# Patient Record
Sex: Male | Born: 1990 | Race: White | Hispanic: No | Marital: Single | Smoking: Current some day smoker
Health system: Southern US, Community
[De-identification: ages and names within clinical notes are randomized; demographics above are authoritative.]

## PROBLEM LIST (undated history)

## (undated) DIAGNOSIS — T7840XA Allergy, unspecified, initial encounter: Secondary | ICD-10-CM

## (undated) DIAGNOSIS — R41840 Attention and concentration deficit: Secondary | ICD-10-CM

## (undated) HISTORY — DX: Allergy, unspecified, initial encounter: T78.40XA

## (undated) HISTORY — DX: Attention and concentration deficit: R41.840

---
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
## 2012-01-23 ENCOUNTER — Ambulatory Visit (INDEPENDENT_AMBULATORY_CARE_PROVIDER_SITE_OTHER): Payer: Managed Care, Other (non HMO) | Admitting: Family Medicine

## 2012-01-23 VITALS — BP 146/73 | HR 57 | Temp 97.5°F | Resp 16 | Ht 70.5 in | Wt 148.0 lb

## 2012-01-23 DIAGNOSIS — K645 Perianal venous thrombosis: Secondary | ICD-10-CM

## 2012-01-23 DIAGNOSIS — K6289 Other specified diseases of anus and rectum: Secondary | ICD-10-CM

## 2012-01-23 DIAGNOSIS — K644 Residual hemorrhoidal skin tags: Secondary | ICD-10-CM

## 2012-01-23 NOTE — Progress Notes (Signed)
  Patient Name: Anthony Frost Date of Birth: [birth year] Medical Record Number: [id number] Gender: male Date of Encounter: 01/23/2012  History of Present Illness:  Anthony Frost is a 21 y.o. very pleasant male patient who presents with the following:  Has to strain to lift a heavy object at work about 10 days ago- then on Monday he noted that he could fell hemorrhoids.  No bleeding.  They get smaller when he is asleep- bigger again when he moves around and works during the day.  Never had these before. Has been able to have a BM with minimal discomfort.  The main discomfort occurs with walking/ lifting.  Had tried OTC pain medications which help some- also is using prep h suppositories at night and prep h gel during the day.   Generally healthy.  No history of operations.   There is no problem list on file for this patient.  Past Medical History  Diagnosis Date  . Allergy    No past surgical history on file. History  Substance Use Topics  . Smoking status: Current Some Day Smoker -- 0.1 packs/day for 3 years    Types: Cigarettes  . Smokeless tobacco: Never Used  . Alcohol Use: Not on file   No family history on file. No Known Allergies  Medication list has been reviewed and updated.  Review of Systems: As per HPI- otherwise negative.   Physical Examination: Filed Vitals:   01/23/12 1049  BP: 146/73  Pulse: 57  Temp: 97.5 F (36.4 C)  TempSrc: Oral  Resp: 16  Height: 5' 10.5" (1.791 m)  Weight: 148 lb (67.132 kg)    Body mass index is 20.94 kg/(m^2).  GEN: WDWN, NAD, Non-toxic, A & O x 3 HEENT: Atraumatic, Normocephalic. Neck supple. No masses, No LAD. Ears and Nose: No external deformity. CV: RRR, No M/G/R. No JVD. No thrill. No extra heart sounds. PULM: CTA B, no wheezes, crackles, rhonchi. No retractions. No resp. distress. No accessory muscle use. ABD: S, NT, ND, +BS. No rebound. No HSM. EXTR: No c/c/e NEURO Normal gait.  PSYCH: Normally interactive.  Conversant. Not depressed or anxious appearing.  Calm demeanor.  Rectal exam: thrombosed hemorrohoid visible at 3:00   VC obtained.  Area prepped with betadine and anesthetized with 1cc of 1% lidocaine.  I and D of thrombosed hemorrhoid with 11 blade and removed clot.  No complications, placed gauze in cleft Assessment and Plan: Thrombosed hemorrhoid, I and D of same.  Should feel better now, instructed re sitz baths and keeping area clean.  Will ooze a bit for the next day or so- if bleeding more or longer let us know.  Keep stool soft. Patient (or parent if minor) instructed to return to clinic or call if not better in 2 day(s).

## 2012-04-02 ENCOUNTER — Telehealth: Payer: Self-pay

## 2012-04-02 ENCOUNTER — Ambulatory Visit: Payer: Managed Care, Other (non HMO) | Admitting: Family Medicine

## 2012-04-02 VITALS — BP 117/67 | HR 74 | Temp 98.1°F | Resp 16 | Ht 70.5 in | Wt 147.0 lb

## 2012-04-02 DIAGNOSIS — R1110 Vomiting, unspecified: Secondary | ICD-10-CM

## 2012-04-02 DIAGNOSIS — R6883 Chills (without fever): Secondary | ICD-10-CM

## 2012-04-02 DIAGNOSIS — J029 Acute pharyngitis, unspecified: Secondary | ICD-10-CM

## 2012-04-02 DIAGNOSIS — D649 Anemia, unspecified: Secondary | ICD-10-CM

## 2012-04-02 LAB — POCT CBC
Granulocyte percent: 78.8 %G (ref 37–80)
HCT, POC: 39.9 % — AB (ref 43.5–53.7)
Hemoglobin: 12.7 g/dL — AB (ref 14.1–18.1)
Lymph, poc: 1.2 (ref 0.6–3.4)
MCH, POC: 27.8 pg (ref 27–31.2)
MCHC: 31.8 g/dL (ref 31.8–35.4)
MCV: 87.4 fL (ref 80–97)
MID (cbc): 0.6 (ref 0–0.9)
MPV: 8.1 fL (ref 0–99.8)
POC Granulocyte: 6.8 (ref 2–6.9)
POC LYMPH PERCENT: 14.4 %L (ref 10–50)
POC MID %: 6.8 %M (ref 0–12)
Platelet Count, POC: 218 10*3/uL (ref 142–424)
RBC: 4.57 M/uL — AB (ref 4.69–6.13)
RDW, POC: 13.5 %
WBC: 8.6 10*3/uL (ref 4.6–10.2)

## 2012-04-02 LAB — POCT RAPID STREP A (OFFICE): Rapid Strep A Screen: NEGATIVE

## 2012-04-02 MED ORDER — ONDANSETRON HCL 8 MG PO TABS: 8.0000 mg | ORAL_TABLET | Freq: Three times a day (TID) | ORAL | Status: AC | PRN

## 2012-04-02 MED ORDER — ONDANSETRON 4 MG PO TBDP
8.0000 mg | ORAL_TABLET | Freq: Once | ORAL | Status: AC
  Administered 2012-04-02: 8 mg via ORAL

## 2012-04-02 NOTE — Telephone Encounter (Signed)
Called ins to get Prior auth on ondansetron and was told ins would cover #12 w/out PA and would then cover a RF of #12 w/in the month if needed. Called pharm and changed # 12 and notified pt. Told pt to CB if he still needs after the #12. Pt agreed.

## 2012-04-02 NOTE — Progress Notes (Signed)
Patient Name: Anthony Frost Date of Birth: Nov 05, 1991 Medical Record Number: [id number] Gender: male Date of Encounter: 04/02/2012  History of Present Illness:  Anthony Frost is a 21 y.o. very pleasant male patient who presents with the following:  Noted onset of a ST Sunday night.  Then yesterday he had some abdominal pain- now resolved.   No fever that he has noted, but he has had chills and "been freezing."  He did vomit this morning but he thought this was due to ST and the fact that he could not cough up his mucus.  He also has a cough and feels very tired. He noted some aches and fatigue yesterday.  Slept well last night but does not feel refreshed.   He also had diarrhea yesterday during the day- better today.    Generally healthy person. Sick contacts at work  There is no problem list on file for this patient.  Past Medical History  Diagnosis Date  . Allergy    No past surgical history on file. History  Substance Use Topics  . Smoking status: Current Some Day Smoker -- 0.1 packs/day for 3 years    Types: Cigarettes  . Smokeless tobacco: Never Used  . Alcohol Use: 0.0 oz/week     social   No family history on file. No Known Allergies  Medication list has been reviewed and updated.  Review of Systems: As per HPI- otherwise negative.   Physical Examination: Filed Vitals:   04/02/12 0929  BP: 117/67  Pulse: 74  Temp: 98.1 F (36.7 C)  TempSrc: Oral  Resp: 16  Height: 5' 10.5" (1.791 m)  Weight: 147 lb (66.679 kg)    Body mass index is 20.79 kg/(m^2).  GEN: WDWN, NAD, Non-toxic, A & O x 3- looks well HEENT: Atraumatic, Normocephalic. Neck supple. No masses, No LAD.  Tm wnl, oropharynx injected but no exudate.   Ears and Nose: No external deformity. CV: RRR, No M/G/R. No JVD. No thrill. No extra heart sounds. PULM: CTA B, no wheezes, crackles, rhonchi. No retractions. No resp. distress. No accessory muscle use. ABD: S, ND +BS. No rebound. No HSM.  There  is no tenderness to abdominal exam.  EXTR: No c/c/e NEURO Normal gait.  PSYCH: Normally interactive. Conversant. Not depressed or anxious appearing.  Calm demeanor.   Given zofran 8mg  here Results for orders placed in visit on 04/02/12  POCT RAPID STREP A (OFFICE)      Component Value Range   Rapid Strep A Screen Negative  Negative   POCT CBC      Component Value Range   WBC 8.6  4.6 - 10.2 (K/uL)   Lymph, poc 1.2  0.6 - 3.4    POC LYMPH PERCENT 14.4  10 - 50 (%L)   MID (cbc) 0.6  0 - 0.9    POC MID % 6.8  0 - 12 (%M)   POC Granulocyte 6.8  2 - 6.9    Granulocyte percent 78.8  37 - 80 (%G)   RBC 4.57 (*) 4.69 - 6.13 (M/uL)   Hemoglobin 12.7 (*) 14.1 - 18.1 (g/dL)   HCT, POC 64.4 (*) 03.4 - 53.7 (%)   MCV 87.4  80 - 97 (fL)   MCH, POC 27.8  27 - 31.2 (pg)   MCHC 31.8  31.8 - 35.4 (g/dL)   RDW, POC 74.2     Platelet Count, POC 218  142 - 424 (K/uL)   MPV 8.1  0 - 99.8 (fL)  Assessment and Plan: 1. Sore throat  POCT rapid strep A, POCT CBC, Culture, Group A Strep  2. Vomiting  ondansetron (ZOFRAN-ODT) disintegrating tablet 8 mg, ondansetron (ZOFRAN) 8 MG tablet  3. Chills    4. Anemia  POCT CBC    Suspect viral URI- await throat culture but doubt strep throat.  Fluids, rest.  OOW for today and tomorrow.  Let us know if not feeling better in the next couple of days- Sooner if worse.

## 2012-04-05 LAB — CULTURE, GROUP A STREP

## 2012-04-07 MED ORDER — PENICILLIN V POTASSIUM 500 MG PO TABS: 500.0000 mg | ORAL_TABLET | Freq: Two times a day (BID) | ORAL | Status: AC

## 2012-04-07 NOTE — Progress Notes (Signed)
Addended by: Abbe Amsterdam C on: 04/07/2012 02:35 PM   Modules accepted: Orders

## 2012-07-31 ENCOUNTER — Ambulatory Visit (INDEPENDENT_AMBULATORY_CARE_PROVIDER_SITE_OTHER): Payer: Managed Care, Other (non HMO) | Admitting: Family Medicine

## 2012-07-31 VITALS — BP 90/60 | HR 91 | Temp 99.2°F | Resp 18

## 2012-07-31 DIAGNOSIS — R066 Hiccough: Secondary | ICD-10-CM

## 2012-07-31 DIAGNOSIS — D649 Anemia, unspecified: Secondary | ICD-10-CM

## 2012-07-31 LAB — POCT CBC
Granulocyte percent: 52.8 %G (ref 37–80)
HCT, POC: 45.9 % (ref 43.5–53.7)
Hemoglobin: 14.2 g/dL (ref 14.1–18.1)
Lymph, poc: 2.5 (ref 0.6–3.4)
MCH, POC: 28.3 pg (ref 27–31.2)
MCHC: 30.9 g/dL — AB (ref 31.8–35.4)
MCV: 91.4 fL (ref 80–97)
MID (cbc): 0.6 (ref 0–0.9)
MPV: 8.8 fL (ref 0–99.8)
POC Granulocyte: 3.5 (ref 2–6.9)
POC LYMPH PERCENT: 37.6 %L (ref 10–50)
POC MID %: 9.6 %M (ref 0–12)
Platelet Count, POC: 245 10*3/uL (ref 142–424)
RBC: 5.02 M/uL (ref 4.69–6.13)
RDW, POC: 13.1 %
WBC: 6.7 10*3/uL (ref 4.6–10.2)

## 2012-07-31 MED ORDER — METOCLOPRAMIDE HCL 10 MG PO TABS: 10.0000 mg | ORAL_TABLET | Freq: Four times a day (QID) | ORAL | Status: DC

## 2012-07-31 NOTE — Patient Instructions (Addendum)
If your hiccups are gone you do not need to take the medication.  However, if they come back you can try the metoclopramide.  Let me know if this does not help.

## 2012-07-31 NOTE — Progress Notes (Signed)
[facility] [address] [place] [address] [phone]  Date:  07/31/2012   Name:  Anthony Frost   DOB:  Apr 15, 1991   MRN:  [id number]  PCP:  No primary provider on file.    Chief Complaint: Hiccups and bloodwork   History of Present Illness:  Anthony Frost is a 21 y.o. very pleasant male patient who presents with the following:  He had noted hiccups for about 4 days- he is not sure why this started.  He drank his protein shake like usual, and then developed the hiccups Sunday morning.  Today is Wednesday.  His hiccups have been persistent since then and were really starting to annoy him and interfered with sleep- however, about an hour ago they finally stopped.  Otherwise he is feeling well- did have some heartburn due to the hiccups.   He is also here to re-check his CBC from May- he was mildly anemia at that time.    There is no problem list on file for this patient.   Past Medical History  Diagnosis Date  . Allergy     No past surgical history on file.  History  Substance Use Topics  . Smoking status: Current Some Day Smoker -- 0.1 packs/day for 3 years    Types: Cigarettes  . Smokeless tobacco: Never Used  . Alcohol Use: 0.0 oz/week     social    No family history on file.  No Known Allergies  Medication list has been reviewed and updated.  No current outpatient prescriptions on file prior to visit.    Review of Systems:  As per HPI- otherwise negative.   Physical Examination: Filed Vitals:   07/31/12 1610  BP: 90/60  Pulse: 91  Temp: 99.2 F (37.3 C)  Resp: 18   There were no vitals filed for this visit. There is no height or weight on file to calculate BMI. Ideal Body Weight:    GEN: WDWN, NAD, Non-toxic, A & O x 3 HEENT: Atraumatic, Normocephalic. Neck supple. No masses, No LAD.  Oropharynx wnl, PEERL, TM wnl Ears and Nose: No external deformity. CV: RRR, No M/G/R. No JVD. No thrill. No extra heart sounds. PULM:  CTA B, no wheezes, crackles, rhonchi. No retractions. No resp. distress. No accessory muscle use. ABD: S, NT, ND EXTR: No c/c/e NEURO Normal gait. No hiccups noted PSYCH: Normally interactive. Conversant. Not depressed or anxious appearing.  Calm demeanor.   Results for orders placed in visit on 07/31/12  POCT CBC      Component Value Range   WBC 6.7  4.6 - 10.2 K/uL   Lymph, poc 2.5  0.6 - 3.4   POC LYMPH PERCENT 37.6  10 - 50 %L   MID (cbc) 0.6  0 - 0.9   POC MID % 9.6  0 - 12 %M   POC Granulocyte 3.5  2 - 6.9   Granulocyte percent 52.8  37 - 80 %G   RBC 5.02  4.69 - 6.13 M/uL   Hemoglobin 14.2  14.1 - 18.1 g/dL   HCT, POC 57.8  46.9 - 53.7 %   MCV 91.4  80 - 97 fL   MCH, POC 28.3  27 - 31.2 pg   MCHC 30.9 (*) 31.8 - 35.4 g/dL   RDW, POC 62.9     Platelet Count, POC 245  142 - 424 K/uL   MPV 8.8  0 - 99.8 fL    Assessment and Plan: 1.  Hiccups  metoCLOPramide (REGLAN) 10 MG tablet  2. Anemia  POCT CBC   Previously noted anemia is resolved.  Persistent hiccups- finally stopped.  Gave rx for reglan which he can use if they come back.  However did warn him that reglan can be associated with tardive dyskinesia, so avoid use unless necessary.  Meds ordered this encounter  Medications  . metoCLOPramide (REGLAN) 10 MG tablet    Sig: Take 1 tablet (10 mg total) by mouth 4 (four) times daily.    Dispense:  30 tablet    Refill:  0   Let me know if any other problems  Sandy Blouch, MD

## 2013-11-13 ENCOUNTER — Ambulatory Visit: Payer: Managed Care, Other (non HMO) | Admitting: Family Medicine

## 2013-11-13 VITALS — BP 133/77 | HR 75 | Temp 98.7°F | Resp 16 | Ht 70.5 in | Wt 148.0 lb

## 2013-11-13 DIAGNOSIS — R05 Cough: Secondary | ICD-10-CM

## 2013-11-13 DIAGNOSIS — J209 Acute bronchitis, unspecified: Secondary | ICD-10-CM

## 2013-11-13 MED ORDER — HYDROCODONE-HOMATROPINE 5-1.5 MG/5ML PO SYRP: 5.0000 mL | ORAL_SOLUTION | Freq: Three times a day (TID) | ORAL | Status: DC | PRN

## 2013-11-13 MED ORDER — AZITHROMYCIN 250 MG PO TABS: ORAL_TABLET | ORAL | Status: DC

## 2013-11-13 NOTE — Progress Notes (Signed)
[facility] [address], [place] [address] [phone]  Date:  11/13/2013   Name:  Anthony Frost   DOB:  01/10/1991   MRN:  [id number]  PCP:  No primary provider on file.    Chief Complaint: Cough, Nasal Congestion, Nausea and Generalized Body Aches   History of Present Illness:  Anthony Frost is a 22 y.o. very pleasant male patient who presents with the following:  He has noted "simple cold sx" for about 3 weeks.  He is worse at night- he will wake up coughing.  He feels SOB when he tries to work- but not when he went snowboarding last weekend.  He thinks that his snowboarding trip may have made him worse.  Feeling tired.   He does feel pretty good during the day, but at night he is having a hard time.  He has not noted a fever.   Cough can be productive especially at night.  He has noted some body aches over the last several days.   He also notes a runny nose and sneezing.   He took dayquil so far today.    He has tried several different OTC medications  He has had a sick contact at work.    He is generally healthy  There are no active problems to display for this patient.   Past Medical History  Diagnosis Date  . Allergy     No past surgical history on file.  History  Substance Use Topics  . Smoking status: Current Some Day Smoker -- 0.10 packs/day for 3 years    Types: Cigarettes  . Smokeless tobacco: Never Used  . Alcohol Use: 0.0 oz/week     Comment: social    No family history on file.  No Known Allergies  Medication list has been reviewed and updated.  Current Outpatient Prescriptions on File Prior to Visit  Medication Sig Dispense Refill  . metoCLOPramide (REGLAN) 10 MG tablet Take 1 tablet (10 mg total) by mouth 4 (four) times daily.  30 tablet  0   No current facility-administered medications on file prior to visit.    Review of Systems:  As per HPI- otherwise negative.   Physical Examination: Filed Vitals:    11/13/13 1406  BP: 133/77  Pulse: 75  Temp: 98.7 F (37.1 C)  Resp: 16   Filed Vitals:   11/13/13 1406  Height: 5' 10.5" (1.791 m)  Weight: 148 lb (67.132 kg)   Body mass index is 20.93 kg/(m^2). Ideal Body Weight: Weight in (lb) to have BMI = 25: 176.4  GEN: WDWN, NAD, Non-toxic, A & O x 3, looks well HEENT: Atraumatic, Normocephalic. Neck supple. No masses, No LAD.  Bilateral TM wnl, oropharynx normal.  PEERL,EOMI.   Ears and Nose: No external deformity. CV: RRR, No M/G/R. No JVD. No thrill. No extra heart sounds. PULM: CTA B, no wheezes, crackles, rhonchi. No retractions. No resp. distress. No accessory muscle use. EXTR: No c/c/e NEURO Normal gait.  PSYCH: Normally interactive. Conversant. Not depressed or anxious appearing.  Calm demeanor.    Assessment and Plan: Cough - Plan: HYDROcodone-homatropine (HYCODAN) 5-1.5 MG/5ML syrup  Acute bronchitis - Plan: azithromycin (ZITHROMAX) 250 MG tablet  Persistent cough that is disturbing sleep.  Bronchitis.  Treat as above.  See patient instructions for more details.     Signed Abbe Amsterdam, MD

## 2013-11-13 NOTE — Patient Instructions (Signed)
Use the azithromycin antibiotic as directed and the cough syrup as needed.  Remember the cough syrup can make you sleepy, so do not use it prior to driving.  Let me know if you are not better soon- Sooner if worse.

## 2015-09-22 ENCOUNTER — Ambulatory Visit (INDEPENDENT_AMBULATORY_CARE_PROVIDER_SITE_OTHER): Payer: Self-pay | Admitting: Family Medicine

## 2015-09-22 VITALS — BP 100/70 | HR 95 | Temp 98.8°F | Resp 16 | Ht 71.0 in | Wt 144.0 lb

## 2015-09-22 DIAGNOSIS — B353 Tinea pedis: Secondary | ICD-10-CM

## 2015-09-22 DIAGNOSIS — Z131 Encounter for screening for diabetes mellitus: Secondary | ICD-10-CM

## 2015-09-22 DIAGNOSIS — D509 Iron deficiency anemia, unspecified: Secondary | ICD-10-CM

## 2015-09-22 LAB — POCT CBC
Granulocyte percent: 57.7 %G (ref 37–80)
HCT, POC: 45.1 % (ref 43.5–53.7)
HEMOGLOBIN: 14.9 g/dL (ref 14.1–18.1)
LYMPH, POC: 3.2 (ref 0.6–3.4)
MCH, POC: 28.9 pg (ref 27–31.2)
MCHC: 33 g/dL (ref 31.8–35.4)
MCV: 87.4 fL (ref 80–97)
MID (CBC): 0.8 (ref 0–0.9)
MPV: 7.1 fL (ref 0–99.8)
POC GRANULOCYTE: 5.4 (ref 2–6.9)
POC LYMPH %: 33.9 % (ref 10–50)
POC MID %: 8.4 %M (ref 0–12)
Platelet Count, POC: 219 10*3/uL (ref 142–424)
RBC: 5.17 M/uL (ref 4.69–6.13)
RDW, POC: 12.7 %
WBC: 9.4 10*3/uL (ref 4.6–10.2)

## 2015-09-22 LAB — GLUCOSE, POCT (MANUAL RESULT ENTRY): POC Glucose: 83 mg/dl (ref 70–99)

## 2015-09-22 MED ORDER — KETOCONAZOLE 2 % EX CREA: 1.0000 "application " | TOPICAL_CREAM | Freq: Two times a day (BID) | CUTANEOUS | Status: DC

## 2015-09-22 MED ORDER — FLUCONAZOLE 150 MG PO TABS: 150.0000 mg | ORAL_TABLET | Freq: Once | ORAL | Status: DC

## 2015-09-22 NOTE — Progress Notes (Signed)
Subjective:    Patient ID: Anthony Frost, male    DOB: 1991/02/02, 24 y.o.   MRN: [id number]  09/22/2015  Recurrent Skin Infections and Depression   HPI This 24 y.o. male presents for evaluation of R foot infection.  Evaluated by another Urgent Care on 03-20-15; treated with antibiotic with improvement. One month ago, recurrent symptoms in R foot; in past two weeks, infection has worsened.  Now also having similar itchiness and burning in R axilla.  Also has L skin lesions on face that are itchy.  R foot infection really itches and burns.  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  Has not applied any ointments or creams.  No fever/chills/sweats.  No foot swelling.  Wound is draining excessively.  ADD managed by local psychiatry group; recently lost insurance because aged out of parents' plan.  Now can only afford Adderall; cannot afford Vyvanse. Mood has been down for the past month off of Vyvanse.  No SI.  Adjusting to Adderall only treatment.     Review of Systems  Constitutional: Negative for fever, chills, diaphoresis and fatigue.  Cardiovascular: Negative for leg swelling.  Skin: Positive for color change, rash and wound.  Neurological: Negative for weakness and numbness.    Past Medical History  Diagnosis Date  . Allergy    History reviewed. No pertinent past surgical history. No Known Allergies  Social History   Social History  . Marital Status: Single    Spouse Name: N/A  . Number of Children: N/A  . Years of Education: N/A   Occupational History  . Not on file.   Social History Main Topics  . Smoking status: Current Some Day Smoker -- 0.10 packs/day for 3 years    Types: Cigarettes  . Smokeless tobacco: Never Used  . Alcohol Use: 0.0 oz/week     Comment: social  . Drug Use: Not on file  . Sexual Activity: Not on file   Other Topics Concern  . Not on file   Social History Narrative    Family History  Problem Relation Age of Onset  . High Cholesterol Mother   . Cancer Father   . High Cholesterol Father   . Hypertension Father        Objective:    BP 100/70 mmHg  Pulse 95  Temp(Src) 98.8 F (37.1 C) (Oral)  Resp 16  Ht  (1.803 m)  Wt 144 lb (65.318 kg)  BMI 20.09 kg/m2  SpO2 98% Physical Exam  Constitutional: He is oriented to person, place, and time. He appears well-developed and well-nourished. No distress.  HENT:  Head: Normocephalic and atraumatic.  Eyes: Conjunctivae and EOM are normal. Pupils are equal, round, and reactive to light.  Neck: Normal range of motion. Neck supple. Carotid bruit is not present. No thyromegaly present.  Cardiovascular: Normal rate, regular rhythm, normal heart sounds and intact distal pulses.  Exam reveals no gallop and no friction rub.   No murmur heard. Pulmonary/Chest: Effort normal and breath sounds normal. He has no wheezes. He has no rales.  Musculoskeletal:       Feet:  Lymphadenopathy:    He has no cervical adenopathy.  Neurological: He is alert and oriented to person, place, and time. No cranial nerve deficit.  Skin: Skin is warm and dry. Rash noted. He is not diaphoretic. There is erythema.  R lateral foot with localized area of moist erythema with  interdigit involvement between 4th and 5th digits and 3rd and 4th digits.  No fluctuance; no streaking.  R axilla with area of erythema without induration; no pustules or vesicles.  L lateral temple region with papular rash.  Psychiatric: He has a normal mood and affect. His behavior is normal.  Nursing note and vitals reviewed.  Results for orders placed or performed in visit on 09/22/15  POCT CBC  Result Value Ref Range   WBC 9.4 4.6 - 10.2 K/uL   Lymph, poc 3.2 0.6 - 3.4   POC LYMPH PERCENT 33.9 10 - 50 %L   MID (cbc) 0.8 0 - 0.9   POC MID % 8.4 0 - 12 %M   POC Granulocyte 5.4 2 - 6.9   Granulocyte percent 57.7 37 - 80 %G   RBC 5.17 4.69 - 6.13 M/uL    Hemoglobin 14.9 14.1 - 18.1 g/dL   HCT, POC 16.145.1 09.643.5 - 53.7 %   MCV 87.4 80 - 97 fL   MCH, POC 28.9 27 - 31.2 pg   MCHC 33.0 31.8 - 35.4 g/dL   RDW, POC 04.512.7 %   Platelet Count, POC 219 142 - 424 K/uL   MPV 7.1 0 - 99.8 fL  POCT glucose (manual entry)  Result Value Ref Range   POC Glucose 83 70 - 99 mg/dl       Assessment & Plan:   1. Tinea pedis of right foot   2. Screening for diabetes mellitus   3. Anemia, iron deficiency    -New. -Rx for Ketoconazole cream and Diflucan. -Change socks frequently; avoid excessive moisture.   -Avoid socks at nighttime.  -RTC for acute worsening.   Orders Placed This Encounter  Procedures  . POCT CBC  . POCT glucose (manual entry)   Meds ordered this encounter  Medications  . lisdexamfetamine (VYVANSE) 40 MG capsule    Sig: Take 40 mg by mouth every morning.  Marland Kitchen. amphetamine-dextroamphetamine (ADDERALL XR) 30 MG 24 hr capsule    Sig: Take 30 mg by mouth daily.  Marland Kitchen. ketoconazole (NIZORAL) 2 % cream    Sig: Apply 1 application topically 2 (two) times daily.    Dispense:  30 g    Refill:  0  . fluconazole (DIFLUCAN) 150 MG tablet    Sig: Take 1 tablet (150 mg total) by mouth once. Repeat in 1 week    Dispense:  2 tablet    Refill:  0    No Follow-up on file.    Kristi Paulita FujitaMartin Smith, M.D. [facility]  [place] [address] [place], [place]  [id number] [phone] phone [phone] fax

## 2015-09-22 NOTE — Patient Instructions (Signed)
1. Wash socks in hot water. 2. Change wet socks frequently. 3.  Do not wear socks at bedtime; allow area to air out.  4.  Return for worsening redness, swelling, pain.  Athlete's Foot Athlete's foot (tinea pedis) is a fungal infection of the skin on the feet. It often occurs on the skin between the toes or underneath the toes. It can also occur on the soles of the feet. Athlete's foot is more likely to occur in hot, humid weather. Not washing your feet or changing your socks often enough can contribute to athlete's foot. The infection can spread from person to person (contagious). CAUSES Athlete's foot is caused by a fungus. This fungus thrives in warm, moist places. Most people get athlete's foot by sharing shower stalls, towels, and wet floors with an infected person. People with weakened immune systems, including those with diabetes, may be more likely to get athlete's foot. SYMPTOMS   Itchy areas between the toes or on the soles of the feet.  White, flaky, or scaly areas between the toes or on the soles of the feet.  Tiny, intensely itchy blisters between the toes or on the soles of the feet.  Tiny cuts on the skin. These cuts can develop a bacterial infection.  Thick or discolored toenails. DIAGNOSIS  Your caregiver can usually tell what the problem is by doing a physical exam. Your caregiver may also take a skin sample from the rash area. The skin sample may be examined under a microscope, or it may be tested to see if fungus will grow in the sample. A sample may also be taken from your toenail for testing. TREATMENT  Over-the-counter and prescription medicines can be used to kill the fungus. These medicines are available as powders or creams. Your caregiver can suggest medicines for you. Fungal infections respond slowly to treatment. You may need to continue using your medicine for several weeks. PREVENTION   Do not share towels.  Wear sandals in wet areas, such as shared locker  rooms and shared showers.  Keep your feet dry. Wear shoes that allow air to circulate. Wear cotton or wool socks. HOME CARE INSTRUCTIONS   Take medicines as directed by your caregiver. Do not use steroid creams on athlete's foot.  Keep your feet clean and cool. Wash your feet daily and dry them thoroughly, especially between your toes.  Change your socks every day. Wear cotton or wool socks. In hot climates, you may need to change your socks 2 to 3 times per day.  Wear sandals or canvas tennis shoes with good air circulation.  If you have blisters, soak your feet in Burow's solution or Epsom salts for 20 to 30 minutes, 2 times a day to dry out the blisters. Make sure you dry your feet thoroughly afterward. SEEK MEDICAL CARE IF:   You have a fever.  You have swelling, soreness, warmth, or redness in your foot.  You are not getting better after 7 days of treatment.  You are not completely cured after 30 days.  You have any problems caused by your medicines. MAKE SURE YOU:   Understand these instructions.  Will watch your condition.  Will get help right away if you are not doing well or get worse.   This information is not intended to replace advice given to you by your health care provider. Make sure you discuss any questions you have with your health care provider.   Document Released: 11/03/2000 Document Revised: 01/29/2012 Document Reviewed: 05/10/2015  Chartered certified accountant Patient Education [employer].

## 2015-10-27 ENCOUNTER — Other Ambulatory Visit: Payer: Self-pay | Admitting: Family Medicine

## 2015-11-06 ENCOUNTER — Other Ambulatory Visit: Payer: Self-pay | Admitting: Family Medicine

## 2015-11-08 NOTE — Telephone Encounter (Signed)
Patient's mom called and wants to leave her contact number because the patient's phone is broke. Mother's name is Raynelle FanningJulie and please call her at [phone]

## 2015-11-08 NOTE — Telephone Encounter (Signed)
Dr. Katrinka BlazingSmith  Patient stated his symptoms cleared for about two weeks and is now back even worse.  Do you want him to take another round of Diflucan or RTC.

## 2015-11-09 MED ORDER — KETOCONAZOLE 2 % EX CREA: 1.0000 "application " | TOPICAL_CREAM | Freq: Two times a day (BID) | CUTANEOUS | Status: DC

## 2015-11-09 NOTE — Telephone Encounter (Signed)
Wrong number for mom.   Tried [facility] number mailbox fill.

## 2015-11-09 NOTE — Telephone Encounter (Signed)
Discussed with Dr. Katrinka BlazingSmith.  Refilled cream, per her recommendations. Use twice daily until symptoms resolve, then  5 additional days.  Meds ordered this encounter  Medications  . ketoconazole (NIZORAL) 2 % cream    Sig: Apply 1 application topically 2 (two) times daily.    Dispense:  60 g    Refill:  0    Order Specific Question:  Supervising Provider    Answer:  DOOLITTLE, ROBERT P [[id number]]

## 2015-11-10 NOTE — Telephone Encounter (Signed)
Called mother back and gave her instr's from [place]. She agreed to advise pt.

## 2016-02-14 ENCOUNTER — Emergency Department (HOSPITAL_BASED_OUTPATIENT_CLINIC_OR_DEPARTMENT_OTHER)
Admission: EM | Admit: 2016-02-14 | Discharge: 2016-02-15 | Disposition: A | Discharge status: Home / Self Care | Payer: BLUE CROSS/BLUE SHIELD | Attending: Emergency Medicine | Admitting: Emergency Medicine

## 2016-02-14 ENCOUNTER — Encounter (HOSPITAL_BASED_OUTPATIENT_CLINIC_OR_DEPARTMENT_OTHER): Payer: Self-pay | Admitting: *Deleted

## 2016-02-14 DIAGNOSIS — F909 Attention-deficit hyperactivity disorder, unspecified type: Secondary | ICD-10-CM | POA: Insufficient documentation

## 2016-02-14 DIAGNOSIS — F17210 Nicotine dependence, cigarettes, uncomplicated: Secondary | ICD-10-CM | POA: Diagnosis not present

## 2016-02-14 DIAGNOSIS — Z79899 Other long term (current) drug therapy: Secondary | ICD-10-CM | POA: Insufficient documentation

## 2016-02-14 DIAGNOSIS — L84 Corns and callosities: Secondary | ICD-10-CM | POA: Diagnosis not present

## 2016-02-14 DIAGNOSIS — M79671 Pain in right foot: Secondary | ICD-10-CM | POA: Diagnosis present

## 2016-02-14 NOTE — Discharge Instructions (Signed)
Corns and Calluses Corns are small areas of thickened skin that occur on the top, sides, or tip of a toe. They contain a cone-shaped core with a point that can press on a nerve below. This causes pain. Calluses are areas of thickened skin that can occur anywhere on the body including hands, fingers, palms, soles of the feet, and heels.Calluses are usually larger than corns.  CAUSES  Corns and calluses are caused by rubbing (friction) or pressure, such as from shoes that are too tight or do not fit properly.  RISK FACTORS Corns are more likely to develop in people who have toe deformities, such as hammer toes. Since calluses can occur with friction to any area of the skin, calluses are more likely to develop in people who:   Work with their hands.  Wear shoes that fit poorly, shoes that are too tight, or shoes that are high-heeled.  Have toes deformities. SYMPTOMS Symptoms of a corn or callus include:  A hard growth on the skin.   Pain or tenderness under the skin.   Redness and swelling.   Increased discomfort while wearing tight-fitting shoes. DIAGNOSIS  Corns and calluses may be diagnosed with a medical history and physical exam.  TREATMENT  Corns and calluses may be treated with:  Removing the cause of the friction or pressure. This may include:  Changing your shoes.  Wearing shoe inserts (orthotics) or other protective layers in your shoes, such as a corn pad.  Wearing gloves.  Medicines to help soften skin in the hardened, thickened areas.  Reducing the size of the corn or callus by removing the dead layers of skin.  Antibiotic medicines to treat infection.  Surgery, if a toe deformity is the cause. HOME CARE INSTRUCTIONS   Take medicines only as directed by your health care provider.  If you were prescribed an antibiotic, finish all of it even if you start to feel better.  Wear shoes that fit well. Avoid wearing high-heeled shoes and shoes that are too tight  or too loose.  Wear any padding, protective layers, gloves, or orthotics as directed by your health care provider.  Soak your hands or feet and then use a file or pumice stone to soften your corn or callus. Do this as directed by your health care provider.  Check your corn or callus every day for signs of infection. Watch for:  Redness, swelling, or pain.  Fluid, blood, or pus. SEEK MEDICAL CARE IF:   Your symptoms do not improve with treatment.  You have increased redness, swelling, or pain at the site of your corn or callus.  You have fluid, blood, or pus coming from your corn or callus.  You have new symptoms.   This information is not intended to replace advice given to you by your health care provider. Make sure you discuss any questions you have with your health care provider.   Document Released: 08/12/2004 Document Revised: 03/23/2015 Document Reviewed: 11/02/2014 Elsevier Interactive Patient Education 2016 [employer]  

## 2016-02-14 NOTE — ED Notes (Signed)
Pain to the bottom of his right foot. A few months ago he had athletes foot per mother.

## 2016-02-14 NOTE — ED Provider Notes (Signed)
CSN: [id number]     Arrival date & time 02/14/16  2059 History   First MD Initiated Contact with Patient 02/14/16 2342     Chief Complaint  Patient presents with  . Foot Pain     (Consider location/radiation/quality/duration/timing/severity/associated sxs/prior Treatment) HPI Comments: Patient reports pain to the outer aspect of his right foot below his 5th toe with thickening of the skin.  Patient is a 25 y.o. male presenting with lower extremity pain. The history is provided by the patient. No language interpreter was used.  Foot Pain This is a new problem. The current episode started in the past 7 days. The problem has been gradually worsening. The symptoms are aggravated by walking.    Past Medical History  Diagnosis Date  . Allergy   . Attention deficit    History reviewed. No pertinent past surgical history. Family History  Problem Relation Age of Onset  . High Cholesterol Mother   . Cancer Father   . High Cholesterol Father   . Hypertension Father    Social History  Substance Use Topics  . Smoking status: Current Some Day Smoker -- 0.10 packs/day for 3 years    Types: Cigarettes  . Smokeless tobacco: Never Used  . Alcohol Use: 0.0 oz/week     Comment: social    Review of Systems  Musculoskeletal:       Foot pain  All other systems reviewed and are negative.     Allergies  Review of patient's allergies indicates no known allergies.  Home Medications   Prior to Admission medications   Medication Sig Start Date End Date Taking? Authorizing Provider  amphetamine-dextroamphetamine (ADDERALL XR) 30 MG 24 hr capsule Take 30 mg by mouth daily.    Historical Provider, MD  fluconazole (DIFLUCAN) 150 MG tablet Take 1 tablet (150 mg total) by mouth once. Repeat in 1 week 09/22/15   Ethelda ChickKristi M Annalisa Colonna, MD  ketoconazole (NIZORAL) 2 % cream Apply 1 application topically 2 (two) times daily. 11/09/15   Chelle Jeffery, PA-C  lisdexamfetamine (VYVANSE) 40 MG capsule Take 40  mg by mouth every morning.    Historical Provider, MD   BP 122/80 mmHg  Pulse 68  Temp(Src) 98.1 F (36.7 C) (Oral)  Resp 18  Ht 5\' 11"  (1.803 m)  Wt 65.772 kg  BMI 20.23 kg/m2  SpO2 98% Physical Exam  Constitutional: He is oriented to person, place, and time. He appears well-developed and well-nourished.  HENT:  Head: Normocephalic.  Eyes: Conjunctivae are normal.  Cardiovascular: Normal rate.   Pulmonary/Chest: Effort normal.  Musculoskeletal: He exhibits tenderness.       Feet:  Neurological: He is alert and oriented to person, place, and time.  Skin: Skin is warm and dry.  Psychiatric: He has a normal mood and affect.  Nursing note and vitals reviewed.   ED Course  Procedures (including critical care time) Labs Review Labs Reviewed - No data to display  Imaging Review No results found. I have personally reviewed and evaluated these images and lab results as part of my medical decision-making.   EKG Interpretation None      MDM   Final diagnoses:  None  Plantar callous. Care instructions provided. Return precautions discussed. Follow-up with PCP or podiatry.      Felicie Mornavid Taleeyah Bora, NP 02/15/16 0113  Paula LibraJohn Molpus, MD 02/15/16 [phone]

## 2016-05-25 ENCOUNTER — Ambulatory Visit (INDEPENDENT_AMBULATORY_CARE_PROVIDER_SITE_OTHER): Payer: BLUE CROSS/BLUE SHIELD

## 2016-05-25 ENCOUNTER — Ambulatory Visit (INDEPENDENT_AMBULATORY_CARE_PROVIDER_SITE_OTHER): Payer: BLUE CROSS/BLUE SHIELD | Admitting: Internal Medicine

## 2016-05-25 VITALS — BP 116/76 | HR 67 | Temp 98.5°F | Resp 18 | Ht 71.0 in | Wt 152.0 lb

## 2016-05-25 DIAGNOSIS — M25571 Pain in right ankle and joints of right foot: Secondary | ICD-10-CM

## 2016-05-25 DIAGNOSIS — M545 Low back pain, unspecified: Secondary | ICD-10-CM

## 2016-05-25 MED ORDER — CYCLOBENZAPRINE HCL 10 MG PO TABS: 10.0000 mg | ORAL_TABLET | Freq: Every day | ORAL | Status: DC

## 2016-05-25 MED ORDER — MELOXICAM 15 MG PO TABS
15.0000 mg | ORAL_TABLET | Freq: Every day | ORAL | Status: DC
Start: 2016-05-25 — End: 2024-03-10

## 2016-05-25 NOTE — Patient Instructions (Signed)
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
## 2018-02-26 ENCOUNTER — Ambulatory Visit (HOSPITAL_COMMUNITY)
Admission: RE | Admit: 2018-02-26 | Discharge: 2018-02-26 | Disposition: A | Discharge status: Home / Self Care | Payer: PRIVATE HEALTH INSURANCE | Source: Ambulatory Visit | Attending: Orthopedic Surgery | Admitting: Orthopedic Surgery

## 2018-02-26 ENCOUNTER — Other Ambulatory Visit (HOSPITAL_COMMUNITY): Payer: Self-pay | Admitting: Orthopedic Surgery

## 2018-02-26 DIAGNOSIS — M79605 Pain in left leg: Secondary | ICD-10-CM

## 2018-02-26 DIAGNOSIS — M79604 Pain in right leg: Secondary | ICD-10-CM | POA: Insufficient documentation

## 2018-02-26 DIAGNOSIS — M7989 Other specified soft tissue disorders: Secondary | ICD-10-CM | POA: Diagnosis not present

## 2018-02-26 NOTE — Progress Notes (Signed)
LLE venous duplex prelim: negative for DVT. Farrel DemarkJill Eunice, RDMS, RVT   [place]

## 2018-05-07 DIAGNOSIS — F32A Depression, unspecified: Secondary | ICD-10-CM | POA: Insufficient documentation

## 2018-05-07 DIAGNOSIS — M25572 Pain in left ankle and joints of left foot: Secondary | ICD-10-CM | POA: Insufficient documentation

## 2018-05-07 DIAGNOSIS — M79672 Pain in left foot: Secondary | ICD-10-CM | POA: Insufficient documentation

## 2018-05-07 DIAGNOSIS — T148XXA Other injury of unspecified body region, initial encounter: Secondary | ICD-10-CM | POA: Insufficient documentation

## 2018-08-02 DIAGNOSIS — Z4889 Encounter for other specified surgical aftercare: Secondary | ICD-10-CM | POA: Insufficient documentation

## 2019-05-22 ENCOUNTER — Other Ambulatory Visit: Payer: Self-pay | Admitting: Anesthesiology

## 2019-05-22 DIAGNOSIS — M25572 Pain in left ankle and joints of left foot: Secondary | ICD-10-CM

## 2019-06-17 ENCOUNTER — Other Ambulatory Visit: Payer: BLUE CROSS/BLUE SHIELD

## 2021-08-16 ENCOUNTER — Emergency Department (HOSPITAL_COMMUNITY): Payer: Worker's Compensation

## 2021-08-16 ENCOUNTER — Encounter (HOSPITAL_COMMUNITY): Payer: Self-pay | Admitting: Emergency Medicine

## 2021-08-16 ENCOUNTER — Emergency Department (HOSPITAL_COMMUNITY)
Admission: EM | Admit: 2021-08-16 | Discharge: 2021-08-16 | Disposition: A | Discharge status: Home / Self Care | Payer: Worker's Compensation | Attending: Emergency Medicine | Admitting: Emergency Medicine

## 2021-08-16 ENCOUNTER — Other Ambulatory Visit: Payer: Self-pay

## 2021-08-16 DIAGNOSIS — R42 Dizziness and giddiness: Secondary | ICD-10-CM | POA: Insufficient documentation

## 2021-08-16 DIAGNOSIS — Y92410 Unspecified street and highway as the place of occurrence of the external cause: Secondary | ICD-10-CM | POA: Insufficient documentation

## 2021-08-16 DIAGNOSIS — S92514B Nondisplaced fracture of proximal phalanx of right lesser toe(s), initial encounter for open fracture: Secondary | ICD-10-CM | POA: Insufficient documentation

## 2021-08-16 DIAGNOSIS — S92215A Nondisplaced fracture of cuboid bone of left foot, initial encounter for closed fracture: Secondary | ICD-10-CM | POA: Diagnosis not present

## 2021-08-16 DIAGNOSIS — Z23 Encounter for immunization: Secondary | ICD-10-CM | POA: Insufficient documentation

## 2021-08-16 DIAGNOSIS — F17210 Nicotine dependence, cigarettes, uncomplicated: Secondary | ICD-10-CM | POA: Insufficient documentation

## 2021-08-16 DIAGNOSIS — M79601 Pain in right arm: Secondary | ICD-10-CM | POA: Insufficient documentation

## 2021-08-16 DIAGNOSIS — S299XXA Unspecified injury of thorax, initial encounter: Secondary | ICD-10-CM | POA: Diagnosis present

## 2021-08-16 DIAGNOSIS — R072 Precordial pain: Secondary | ICD-10-CM | POA: Diagnosis not present

## 2021-08-16 DIAGNOSIS — S2242XA Multiple fractures of ribs, left side, initial encounter for closed fracture: Secondary | ICD-10-CM | POA: Diagnosis not present

## 2021-08-16 LAB — CBC WITH DIFFERENTIAL/PLATELET
Abs Immature Granulocytes: 0.12 10*3/uL — ABNORMAL HIGH (ref 0.00–0.07)
Basophils Absolute: 0.1 10*3/uL (ref 0.0–0.1)
Basophils Relative: 1 %
Eosinophils Absolute: 0.5 10*3/uL (ref 0.0–0.5)
Eosinophils Relative: 4 %
HCT: 40.7 % (ref 39.0–52.0)
Hemoglobin: 13.3 g/dL (ref 13.0–17.0)
Immature Granulocytes: 1 %
Lymphocytes Relative: 19 %
Lymphs Abs: 2.1 10*3/uL (ref 0.7–4.0)
MCH: 28.7 pg (ref 26.0–34.0)
MCHC: 32.7 g/dL (ref 30.0–36.0)
MCV: 87.9 fL (ref 80.0–100.0)
Monocytes Absolute: 0.9 10*3/uL (ref 0.1–1.0)
Monocytes Relative: 8 %
Neutro Abs: 7.5 10*3/uL (ref 1.7–7.7)
Neutrophils Relative %: 67 %
Platelets: 229 10*3/uL (ref 150–400)
RBC: 4.63 MIL/uL (ref 4.22–5.81)
RDW: 12.6 % (ref 11.5–15.5)
WBC: 11.1 10*3/uL — ABNORMAL HIGH (ref 4.0–10.5)
nRBC: 0 % (ref 0.0–0.2)

## 2021-08-16 LAB — BASIC METABOLIC PANEL
Anion gap: 10 (ref 5–15)
BUN: 11 mg/dL (ref 6–20)
CO2: 27 mmol/L (ref 22–32)
Calcium: 9.5 mg/dL (ref 8.9–10.3)
Chloride: 103 mmol/L (ref 98–111)
Creatinine, Ser: 0.85 mg/dL (ref 0.61–1.24)
GFR, Estimated: >60 mL/min (ref >60)
Glucose, Bld: 82 mg/dL (ref 70–99)
Potassium: 4.2 mmol/L (ref 3.5–5.1)
Sodium: 140 mmol/L (ref 135–145)

## 2021-08-16 MED ORDER — TETANUS-DIPHTH-ACELL PERTUSSIS 5-2.5-18.5 LF-MCG/0.5 IM SUSY
0.5000 mL | PREFILLED_SYRINGE | Freq: Once | INTRAMUSCULAR | Status: AC
  Administered 2021-08-16: 0.5 mL via INTRAMUSCULAR
  Filled 2021-08-16: qty 0.5

## 2021-08-16 MED ORDER — OXYCODONE-ACETAMINOPHEN 5-325 MG PO TABS: 1.0000 | ORAL_TABLET | Freq: Three times a day (TID) | ORAL | 0 refills | Status: AC | PRN

## 2021-08-16 MED ORDER — IBUPROFEN 600 MG PO TABS: 600.0000 mg | ORAL_TABLET | Freq: Two times a day (BID) | ORAL | 0 refills | Status: AC

## 2021-08-16 MED ORDER — IOHEXOL 350 MG/ML SOLN
75.0000 mL | Freq: Once | INTRAVENOUS | Status: AC | PRN
  Administered 2021-08-16: 60 mL via INTRAVENOUS
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
## 2021-08-16 NOTE — Discharge Instructions (Addendum)
You have 2 nondisplaced rib fractures on the left side I recommend over-the-counter pain medication as needed, giving ibuprofen as well as oxycodone.  Given a incentive spirometer please use this 2-3 times a day for the next 4 weeks is help prevent pneumonia.  Follow-up PCP in 4-week time for repeat chest x-ray. You have a fracture in your left ankle as well as your right second toe.  I placed you in a postop shoe for your right foot please wear during the day you may take off at nighttime.  I placed in a cam boot please wear into the day you may take off at nighttime. I want you to follow-up with orthopedic surgery for the fractures of your left and right foot.  Come back to the emergency department if you develop chest pain, shortness of breath, severe abdominal pain, uncontrolled nausea, vomiting, diarrhea.  I have given you a short course of narcotics please take as prescribed.  This medication can make you drowsy do not consume alcohol or operate heavy machinery when taking this medication.  This medication is Tylenol in it do not take Tylenol and take this medication.
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
## 2021-09-14 ENCOUNTER — Ambulatory Visit (HOSPITAL_COMMUNITY)
Admission: RE | Admit: 2021-09-14 | Discharge: 2021-09-14 | Disposition: A | Discharge status: Home / Self Care | Payer: Worker's Compensation | Source: Ambulatory Visit

## 2021-09-14 ENCOUNTER — Other Ambulatory Visit: Payer: Self-pay
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
## 2021-09-20 ENCOUNTER — Ambulatory Visit
Admission: RE | Admit: 2021-09-20 | Discharge: 2021-09-20 | Disposition: A | Discharge status: Home / Self Care | Payer: Worker's Compensation | Source: Ambulatory Visit | Attending: Family Medicine | Admitting: Family Medicine

## 2021-09-20 ENCOUNTER — Other Ambulatory Visit: Payer: Self-pay

## 2021-09-20 ENCOUNTER — Other Ambulatory Visit: Payer: Self-pay | Admitting: Family Medicine

## 2021-09-20 DIAGNOSIS — S2232XD Fracture of one rib, left side, subsequent encounter for fracture with routine healing: Secondary | ICD-10-CM
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
## 2022-08-27 ENCOUNTER — Ambulatory Visit
Admission: RE | Admit: 2022-08-27 | Discharge: 2022-08-27 | Disposition: A | Discharge status: Home / Self Care | Payer: BC Managed Care – PPO | Source: Ambulatory Visit | Attending: Internal Medicine | Admitting: Internal Medicine

## 2022-08-27 VITALS — BP 124/81 | HR 76 | Temp 98.1°F | Resp 16

## 2022-08-27 DIAGNOSIS — L237 Allergic contact dermatitis due to plants, except food: Secondary | ICD-10-CM | POA: Diagnosis not present

## 2022-08-27 MED ORDER — PREDNISONE 20 MG PO TABS: 40.0000 mg | ORAL_TABLET | Freq: Every day | ORAL | 0 refills | Status: AC

## 2022-08-27 NOTE — ED Provider Notes (Signed)
[facility]    CSN: [id number] Arrival date & time: 08/27/22  0858      History   Chief Complaint Chief Complaint  Patient presents with   Poison Ivy    Rash and itching - Entered by patient    HPI Anthony Frost is a 31 y.o. male.   Patient presents with itchy rash to bilateral upper extremities and legs that started about a week prior.  Patient reports that he has used several over-the-counter lotions and remedies with no improvement in symptoms.  Denies any associated fever.  Patient reports that rash started after he did some yard work so he is attributing symptoms to poison ivy.  Denies feelings of throat closing or shortness of breath.   Poison Ivy    Past Medical History:  Diagnosis Date   Allergy    Attention deficit     There are no problems to display for this patient.   History reviewed. No pertinent surgical history.     Home Medications    Prior to Admission medications   Medication Sig Start Date End Date Taking? Authorizing Provider  predniSONE (DELTASONE) 20 MG tablet Take 2 tablets (40 mg total) by mouth daily for 5 days. 08/27/22 09/01/22 Yes Reeve Mallo, Michele Rockers, FNP  amphetamine-dextroamphetamine (ADDERALL XR) 30 MG 24 hr capsule Take 30 mg by mouth daily. Reported on 05/25/2016    [provider]  cyclobenzaprine (FLEXERIL) 10 MG tablet Take 1 tablet (10 mg total) by mouth at bedtime. For muscle spasm 05/25/16   Leandrew Koyanagi, MD  lisdexamfetamine (VYVANSE) 40 MG capsule Take 40 mg by mouth every morning. Reported on 05/25/2016    [provider]  meloxicam (MOBIC) 15 MG tablet Take 1 tablet (15 mg total) by mouth daily. As antiinflammatory 05/25/16   Leandrew Koyanagi, MD    Family History Family History  Problem Relation Age of Onset   High Cholesterol Mother    Cancer Father    High Cholesterol Father    Hypertension Father     Social History Social History   Tobacco Use   Smoking status: Some Days     Packs/day: 0.10    Years: 3.00    Total pack years: 0.30    Types: Cigarettes   Smokeless tobacco: Never  Substance Use Topics   Alcohol use: Yes    Comment: social     Allergies   Patient has no known allergies.   Review of Systems Review of Systems Per HPI  Physical Exam Triage Vital Signs ED Triage Vitals [08/27/22 0918]  Enc Vitals Group     BP 124/81     Pulse Rate 76     Resp 16     Temp 98.1 F (36.7 C)     Temp src      SpO2 95 %     Weight      Height      Head Circumference      Peak Flow      Pain Score 0     Pain Loc      Pain Edu?      Excl. in [place]?    No data found.  Updated Vital Signs BP 124/81   Pulse 76   Temp 98.1 F (36.7 C)   Resp 16   SpO2 95%   Visual Acuity Right Eye Distance:   Left Eye Distance:   Bilateral Distance:    Right Eye Near:   Left Eye Near:  Bilateral Near:     Physical Exam Constitutional:      General: He is not in acute distress.    Appearance: Normal appearance. He is not toxic-appearing or diaphoretic.  HENT:     Head: Normocephalic and atraumatic.  Eyes:     Extraocular Movements: Extraocular movements intact.     Conjunctiva/sclera: Conjunctivae normal.  Pulmonary:     Effort: Pulmonary effort is normal.  Skin:    Comments: Patches of erythematous, slightly raised maculopapular/vesicular rash present to bilateral forearms, hands, right lower leg.  Neurological:     General: No focal deficit present.     Mental Status: He is alert and oriented to person, place, and time. Mental status is at baseline.  Psychiatric:        Mood and Affect: Mood normal.        Behavior: Behavior normal.        Thought Content: Thought content normal.        Judgment: Judgment normal.      UC Treatments / Results  Labs (all labs ordered are listed, but only abnormal results are displayed) Labs Reviewed - No data to display  EKG   Radiology No results found.  Procedures Procedures (including critical  care time)  Medications Ordered in UC Medications - No data to display  Initial Impression / Assessment and Plan / UC Course  I have reviewed the triage vital signs and the nursing notes.  Pertinent labs & imaging results that were available during my care of the patient were reviewed by me and considered in my medical decision making (see chart for details).     Rash is consistent with allergic contact dermatitis versus irritant contact dermatitis which is most likely related to poison ivy given patient's close exposure recently.  Given symptoms have been refractory to over-the-counter remedies, will treat with prednisone.  Patient reports he has taken this before and tolerated well.  Patient denies he takes any daily medications and there is not any obvious contraindication to prednisone noted in patient's history.  Patient was advised to follow-up if symptoms persist or worsen.  Patient verbalized understanding and was agreeable with plan. Final Clinical Impressions(s) / UC Diagnoses   Final diagnoses:  Poison ivy dermatitis     Discharge Instructions      I have prescribed you prednisone which should help alleviate itching and rash.  Please follow-up if symptoms persist or worsen.    ED Prescriptions     Medication Sig Dispense Auth. Provider   predniSONE (DELTASONE) 20 MG tablet Take 2 tablets (40 mg total) by mouth daily for 5 days. 10 tablet Gustavus Bryant, [place]      PDMP not reviewed this encounter.   Gustavus Bryant, [place] 08/27/22 1005

## 2022-08-27 NOTE — ED Triage Notes (Signed)
Pt is present today with concerns for poison ivy on his arms, legs, and fingers. Pt sx started last Sunday

## 2022-08-27 NOTE — Discharge Instructions (Signed)
I have prescribed you prednisone which should help alleviate itching and rash.  Please follow-up if symptoms persist or worsen.

## 2022-12-16 ENCOUNTER — Emergency Department
Admission: EM | Admit: 2022-12-16 | Discharge: 2022-12-16 | Disposition: A | Discharge status: Home / Self Care | Payer: BC Managed Care – PPO | Attending: Emergency Medicine | Admitting: Emergency Medicine

## 2022-12-16 DIAGNOSIS — W268XXA Contact with other sharp object(s), not elsewhere classified, initial encounter: Secondary | ICD-10-CM | POA: Diagnosis not present

## 2022-12-16 DIAGNOSIS — Y990 Civilian activity done for income or pay: Secondary | ICD-10-CM | POA: Insufficient documentation

## 2022-12-16 DIAGNOSIS — S0181XA Laceration without foreign body of other part of head, initial encounter: Secondary | ICD-10-CM | POA: Insufficient documentation

## 2022-12-16 DIAGNOSIS — S0990XA Unspecified injury of head, initial encounter: Secondary | ICD-10-CM | POA: Diagnosis present

## 2022-12-16 DIAGNOSIS — S0191XA Laceration without foreign body of unspecified part of head, initial encounter: Secondary | ICD-10-CM

## 2022-12-16 DIAGNOSIS — Z23 Encounter for immunization: Secondary | ICD-10-CM | POA: Diagnosis not present

## 2022-12-16 MED ORDER — TETANUS-DIPHTH-ACELL PERTUSSIS 5-2.5-18.5 LF-MCG/0.5 IM SUSY
0.5000 mL | PREFILLED_SYRINGE | Freq: Once | INTRAMUSCULAR | Status: AC
  Administered 2022-12-16: 0.5 mL via INTRAMUSCULAR
  Filled 2022-12-16: qty 0.5

## 2022-12-16 MED ORDER — LIDOCAINE HCL (PF) 1 % IJ SOLN
5.0000 mL | Freq: Once | INTRAMUSCULAR | Status: AC
  Administered 2022-12-16: 5 mL
  Filled 2022-12-16: qty 5
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
## 2022-12-16 NOTE — ED Notes (Signed)
Laceration noted to upper right side of forehead. Sutured by provider. Patient tolerated well. Discharge instructions explained to patient and family at this time. Patient and family state they understand and agree.

## 2022-12-16 NOTE — Discharge Instructions (Signed)
You were evaluated in the emergency department for a laceration. It was repaired with sutures. Keep the area clean and dry.  Wash multiple times per day with soap and water.  Do not go into the ocean or swimming pool.  Return to the emergency department or your primary care physician's office in 7 days for suture removal.  Return to the emergency department for:  -- Fever > 100.4F -- Increase pain in the wound -- Increase redness and swelling -- Pus coming from the wound -- Wound bleeds more than a small amount or it does not stop -- Wound edges come apart -- Severe pain -- Weakness or numbness in the affected area  Or any other new or worsening symptoms. It was a pleasure caring for you.  

## 2022-12-16 NOTE — ED Provider Notes (Signed)
[facility] Provider Note    Event Date/Time   First MD Initiated Contact with Patient 12/16/22 1756     (approximate)   History   Head Injury   HPI  Anthony Frost is a 32 y.o. male with no reported past medical history presents today for evaluation of laceration to his forehead.  Patient reports that he works for [employer] he leaned over in a fan sliced the top of his forehead.  He denies loss of consciousness.  He is unsure of his last tetanus shot.  He has not had any paresthesias or weakness anywhere.  No headache.  No other injury sustained.  No vomiting.  No visual changes.  There are no problems to display for this patient.         Physical Exam   Triage Vital Signs: ED Triage Vitals [12/16/22 1753]  Enc Vitals Group     BP 130/84     Pulse Rate 76     Resp 18     Temp 98 F (36.7 C)     Temp Source Oral     SpO2 99 %     Weight      Height      Head Circumference      Peak Flow      Pain Score 4     Pain Loc      Pain Edu?      Excl. in [place]?     Most recent vital signs: Vitals:   12/16/22 1753 12/16/22 1830  BP: 130/84 122/75  Pulse: 76 74  Resp: 18 18  Temp: 98 F (36.7 C) 98 F (36.7 C)  SpO2: 99% 98%    Physical Exam Vitals and nursing note reviewed.  Constitutional:      General: Awake and alert. No acute distress.    Appearance: Normal appearance. The patient is normal weight.  HENT:     Head: Normocephalic.  3.5 cm linear laceration to top of forehead, no hematoma.  Approximately 2 mm deep.  Nongaping    Mouth: Mucous membranes are moist.  Eyes:     General: PERRL. Normal EOMs        Right eye: No discharge.        Left eye: No discharge.     Conjunctiva/sclera: Conjunctivae normal.  Cardiovascular:     Rate and Rhythm: Normal rate and regular rhythm.     Pulses: Normal pulses.  Pulmonary:     Effort: Pulmonary effort is normal. No respiratory distress.     Breath sounds: Normal breath sounds.   Abdominal:     Abdomen is soft. There is no abdominal tenderness.  Musculoskeletal:        General: No swelling. Normal range of motion.     Cervical back: Normal range of motion and neck supple.  No midline cervical spine tenderness.  Full range of motion of neck.  Negative Spurling test.  Negative Lhermitte sign.  Normal strength and sensation in bilateral upper extremities. Normal grip strength bilaterally.  Normal intrinsic muscle function of the hand bilaterally.  Normal radial pulses bilaterally. Skin:    General: Skin is warm and dry.     Capillary Refill: Capillary refill takes less than 2 seconds.     Findings: No rash.  Neurological:     Mental Status: The patient is awake and alert.  Neurological: GCS 15 alert and oriented x3 Normal speech, no expressive or receptive aphasia or dysarthria Cranial nerves II  through XII intact Normal visual fields 5 out of 5 strength in all 4 extremities with intact sensation throughout No extremity drift Normal finger-to-nose testing, no limb or truncal ataxia     ED Results / Procedures / Treatments   Labs (all labs ordered are listed, but only abnormal results are displayed) Labs Reviewed - No data to display   EKG     RADIOLOGY     PROCEDURES:  Critical Care performed:   Marland KitchenMarland KitchenLaceration Repair  Date/Time: 12/16/2022 6:27 PM  Performed by: Marquette Old, PA-C Authorized by: Marquette Old, PA-C   Consent:    Consent obtained:  Verbal   Consent given by:  Patient   Risks, benefits, and alternatives were discussed: yes     Risks discussed:  Infection, need for additional repair, nerve damage, pain, poor cosmetic result, poor wound healing, retained foreign body, tendon damage and vascular damage   Alternatives discussed:  No treatment Universal protocol:    Procedure explained and questions answered to patient or proxy's satisfaction: yes     Relevant documents present and verified: yes     Test results available:  yes     Required blood products, implants, devices, and special equipment available: yes     Site/side marked: yes     Immediately prior to procedure, a time out was called: yes     Patient identity confirmed:  Verbally with patient Anesthesia:    Anesthesia method:  Local infiltration   Local anesthetic:  Lidocaine 1% w/o epi Laceration details:    Location:  Face   Face location:  Forehead   Length (cm):  3.5   Depth (mm):  3 Pre-procedure details:    Preparation:  Patient was prepped and draped in usual sterile fashion Exploration:    Limited defect created (wound extended): no     Hemostasis achieved with:  Direct pressure   Imaging outcome: foreign body noted     Wound exploration: wound explored through full range of motion     Wound extent: areolar tissue not violated, fascia not violated, no foreign body, no signs of injury, no nerve damage, no tendon damage, no underlying fracture and no vascular damage     Contaminated: no   Treatment:    Amount of cleaning:  Standard   Irrigation solution:  Sterile saline   Irrigation volume:  500cc   Irrigation method:  Pressure wash and syringe   Debridement:  None   Undermining:  None   Scar revision: no   Skin repair:    Repair method:  Sutures   Suture size:  6-0   Suture material:  Nylon   Suture technique:  Simple interrupted   Number of sutures:  5 Repair type:    Repair type:  Simple Post-procedure details:    Dressing:  Open (no dressing)   Procedure completion:  Tolerated well, no immediate complications    MEDICATIONS ORDERED IN ED: Medications  Tdap (BOOSTRIX) injection 0.5 mL (0.5 mLs Intramuscular Given 12/16/22 1806)  lidocaine (PF) (XYLOCAINE) 1 % injection 5 mL (5 mLs Infiltration Given 12/16/22 1815)     IMPRESSION / MDM / [place] / ED COURSE  I reviewed the triage vital signs and the nursing notes.   Differential diagnosis includes, but is not limited to, laceration, retained foreign  body, contusion, concussion.  Patient is awake and alert, hemodynamically stable and neurovascularly and neurologically intact.  He has a 3.5 cm laceration to the top of his forehead, hemostatic.  No focal neurological findings, no visual changes, no vomiting, no loss of consciousness, do not suspect intracranial hemorrhage or skull fracture.  No indication for CT head or neck per [place] criteria.  Laceration was anesthetized with lidocaine, irrigated with normal saline with pressure irrigation with syringe, and closed with sutures.  We discussed suture care time of removal.  We also discussed ways to help minimize scarring in the future including sun protection.  We discussed return precautions and the importance of close outpatient follow-up.  Patient understands and agrees with plan.  He was discharged in stable condition.  Patient's presentation is most consistent with acute illness / injury with system symptoms.     FINAL CLINICAL IMPRESSION(S) / ED DIAGNOSES   Final diagnoses:  Laceration of head without foreign body, unspecified part of head, initial encounter  Injury of head, initial encounter     Rx / DC Orders   ED Discharge Orders     None        Note:  This document was prepared using Dragon voice recognition software and may include unintentional dictation errors.   Keturah Shavers 12/16/22 1835    Georga Hacking, MD 12/17/22 1640

## 2022-12-24 ENCOUNTER — Ambulatory Visit
Admission: EM | Admit: 2022-12-24 | Discharge: 2022-12-24 | Disposition: A | Discharge status: Home / Self Care | Payer: BC Managed Care – PPO | Attending: Physician Assistant | Admitting: Physician Assistant

## 2022-12-24 DIAGNOSIS — S0181XD Laceration without foreign body of other part of head, subsequent encounter: Secondary | ICD-10-CM | POA: Diagnosis not present

## 2022-12-24 NOTE — ED Triage Notes (Signed)
Pt presents to have sutures removed from forehead from laceration injury over a week ago; pt states he is still having a lot of head pain.

## 2022-12-24 NOTE — ED Provider Notes (Signed)
[facility]    CSN: [id number] Arrival date & time: 12/24/22  1048      History   Chief Complaint Chief Complaint  Patient presents with   Suture / Staple Removal    And check head for soreness - Entered by patient    HPI Anthony Frost is a 32 y.o. male.   Patient here today for evaluation of laceration to his forehead that occurred about a week ago.  He had a sutured in the emergency room and is here for suture removal.  He states he does have some tenderness to the top of his head diffusely but no headaches, nausea or vomiting.  He denies any vision changes. Denies LOC at time of accident.  The history is provided by the patient.  Suture / Staple Removal Pertinent negatives include no shortness of breath.    Past Medical History:  Diagnosis Date   Allergy    Attention deficit     There are no problems to display for this patient.   History reviewed. No pertinent surgical history.     Home Medications    Prior to Admission medications   Medication Sig Start Date End Date Taking? Authorizing Provider  amphetamine-dextroamphetamine (ADDERALL XR) 30 MG 24 hr capsule Take 30 mg by mouth daily. Reported on 05/25/2016    [provider]  cyclobenzaprine (FLEXERIL) 10 MG tablet Take 1 tablet (10 mg total) by mouth at bedtime. For muscle spasm 05/25/16   Leandrew Koyanagi, MD  lisdexamfetamine (VYVANSE) 40 MG capsule Take 40 mg by mouth every morning. Reported on 05/25/2016    [provider]  meloxicam (MOBIC) 15 MG tablet Take 1 tablet (15 mg total) by mouth daily. As antiinflammatory 05/25/16   Leandrew Koyanagi, MD    Family History Family History  Problem Relation Age of Onset   High Cholesterol Mother    Cancer Father    High Cholesterol Father    Hypertension Father     Social History Social History   Tobacco Use   Smoking status: Some Days    Packs/day: 0.10    Years: 3.00    Total pack years: 0.30    Types: Cigarettes    Smokeless tobacco: Never  Substance Use Topics   Alcohol use: Yes    Comment: social     Allergies   Patient has no known allergies.   Review of Systems Review of Systems  Constitutional:  Negative for chills and fever.  Eyes:  Negative for discharge and redness.  Respiratory:  Negative for shortness of breath.   Skin:  Positive for wound. Negative for color change.  Neurological:  Negative for numbness.     Physical Exam Triage Vital Signs ED Triage Vitals [12/24/22 1158]  Enc Vitals Group     BP 108/66     Pulse Rate 73     Resp 18     Temp 98 F (36.7 C)     Temp Source Oral     SpO2 98 %     Weight      Height      Head Circumference      Peak Flow      Pain Score 5     Pain Loc      Pain Edu?      Excl. in [place]?    No data found.  Updated Vital Signs BP 108/66 (BP Location: Left Arm)   Pulse 73   Temp 98 F (36.7  C) (Oral)   Resp 18   SpO2 98%      Physical Exam Vitals and nursing note reviewed.  Constitutional:      General: He is not in acute distress.    Appearance: Normal appearance. He is not ill-appearing.  HENT:     Head: Normocephalic and atraumatic.  Eyes:     Conjunctiva/sclera: Conjunctivae normal.  Cardiovascular:     Rate and Rhythm: Normal rate.  Pulmonary:     Effort: Pulmonary effort is normal.  Skin:    Comments: Laceration with well approximated edges, clean, no erythema, drainage  Neurological:     Mental Status: He is alert.  Psychiatric:        Mood and Affect: Mood normal.        Behavior: Behavior normal.        Thought Content: Thought content normal.      UC Treatments / Results  Labs (all labs ordered are listed, but only abnormal results are displayed) Labs Reviewed - No data to display  EKG   Radiology No results found.  Procedures Procedures (including critical care time)  Medications Ordered in UC Medications - No data to display  Initial Impression / Assessment and Plan / UC Course  I  have reviewed the triage vital signs and the nursing notes.  Pertinent labs & imaging results that were available during my care of the patient were reviewed by me and considered in my medical decision making (see chart for details).    Sutures removed without complication. Recommend follow up with any further concerns.   Final Clinical Impressions(s) / UC Diagnoses   Final diagnoses:  Forehead laceration, subsequent encounter   Discharge Instructions   None    ED Prescriptions   None    PDMP not reviewed this encounter.   Francene Finders, PA-C 12/24/22 [phone]

## 2023-04-17 ENCOUNTER — Ambulatory Visit
Admission: RE | Admit: 2023-04-17 | Discharge: 2023-04-17 | Disposition: A | Discharge status: Home / Self Care | Payer: BC Managed Care – PPO | Source: Ambulatory Visit | Attending: Nurse Practitioner | Admitting: Nurse Practitioner

## 2023-04-17 ENCOUNTER — Other Ambulatory Visit: Payer: Self-pay

## 2023-04-17 VITALS — BP 127/79 | HR 83 | Temp 98.6°F | Resp 16

## 2023-04-17 DIAGNOSIS — S70361A Insect bite (nonvenomous), right thigh, initial encounter: Secondary | ICD-10-CM

## 2023-04-17 DIAGNOSIS — W57XXXA Bitten or stung by nonvenomous insect and other nonvenomous arthropods, initial encounter: Secondary | ICD-10-CM | POA: Diagnosis not present

## 2023-04-17 MED ORDER — DOXYCYCLINE HYCLATE 100 MG PO CAPS
100.0000 mg | ORAL_CAPSULE | Freq: Two times a day (BID) | ORAL | 0 refills | Status: DC
Start: 2023-04-17 — End: 2024-03-10

## 2023-04-17 NOTE — Discharge Instructions (Addendum)
Doxycycline twice daily for 10 days Please follow-up with your PCP if your symptoms do not improve Please go to the ER for any worsening symptoms

## 2023-04-17 NOTE — ED Triage Notes (Signed)
Pt presents to UC w/ c/o bug bite on right thigh since 4 days ago. Pt states it was a tick. Wound is round and red.

## 2023-04-17 NOTE — ED Provider Notes (Signed)
[facility]    CSN: [id number] Arrival date & time: 04/17/23  1823      History   Chief Complaint Chief Complaint  Patient presents with   Tick Removal    I removed a tick about a week ago, but now there's a red ring around the bite mark. I may not have gotten all of the tick out. I think it may be infected. - Entered by patient    HPI Anthony Frost is a 32 y.o. male presents for evaluation of a tick bite.  Patient reports he had a tick on his right inner thigh about 5 days ago that he removed.  States the tick was intact after removal.  He noticed redness extending from the tick bite and it was incredibly pruritic.  He thinks it was a deer tick.  No fevers or chills.  He has been using an over-the-counter topical antibiotic ointment with some improvement in symptoms.  No other concerns at this time.  HPI  Past Medical History:  Diagnosis Date   Allergy    Attention deficit     There are no problems to display for this patient.   History reviewed. No pertinent surgical history.     Home Medications    Prior to Admission medications   Medication Sig Start Date End Date Taking? Authorizing Provider  doxycycline (VIBRAMYCIN) 100 MG capsule Take 1 capsule (100 mg total) by mouth 2 (two) times daily. 04/17/23  Yes Radford Pax, NP  amphetamine-dextroamphetamine (ADDERALL XR) 30 MG 24 hr capsule Take 30 mg by mouth daily. Reported on 05/25/2016    [provider]  cyclobenzaprine (FLEXERIL) 10 MG tablet Take 1 tablet (10 mg total) by mouth at bedtime. For muscle spasm 05/25/16   Tonye Pearson, MD  lisdexamfetamine (VYVANSE) 40 MG capsule Take 40 mg by mouth every morning. Reported on 05/25/2016    [provider]  meloxicam (MOBIC) 15 MG tablet Take 1 tablet (15 mg total) by mouth daily. As antiinflammatory 05/25/16   Tonye Pearson, MD    Family History Family History  Problem Relation Age of Onset   High Cholesterol Mother    Cancer  Father    High Cholesterol Father    Hypertension Father     Social History Social History   Tobacco Use   Smoking status: Some Days    Packs/day: 0.10    Years: 3.00    Additional pack years: 0.00    Total pack years: 0.30    Types: Cigarettes   Smokeless tobacco: Never  Substance Use Topics   Alcohol use: Yes    Comment: social     Allergies   Patient has no known allergies.   Review of Systems Review of Systems  Skin:        Tick bite     Physical Exam Triage Vital Signs ED Triage Vitals [04/17/23 1850]  Enc Vitals Group     BP 127/79     Pulse Rate 83     Resp 16     Temp 98.6 F (37 C)     Temp Source Oral     SpO2 95 %     Weight      Height      Head Circumference      Peak Flow      Pain Score      Pain Loc      Pain Edu?      Excl. in GC?  No data found.  Updated Vital Signs BP 127/79 (BP Location: Left Arm)   Pulse 83   Temp 98.6 F (37 C) (Oral)   Resp 16   SpO2 95%   Visual Acuity Right Eye Distance:   Left Eye Distance:   Bilateral Distance:    Right Eye Near:   Left Eye Near:    Bilateral Near:     Physical Exam Vitals and nursing note reviewed.  Constitutional:      General: He is not in acute distress.    Appearance: Normal appearance. He is not ill-appearing, toxic-appearing or diaphoretic.  HENT:     Head: Normocephalic and atraumatic.  Eyes:     Pupils: Pupils are equal, round, and reactive to light.  Cardiovascular:     Rate and Rhythm: Normal rate.  Pulmonary:     Effort: Pulmonary effort is normal.  Skin:      Neurological:     General: No focal deficit present.     Mental Status: He is alert and oriented to person, place, and time.  Psychiatric:        Mood and Affect: Mood normal.        Behavior: Behavior normal.      UC Treatments / Results  Labs (all labs ordered are listed, but only abnormal results are displayed) Labs Reviewed - No data to display  EKG   Radiology No results  found.  Procedures Procedures (including critical care time)  Medications Ordered in UC Medications - No data to display  Initial Impression / Assessment and Plan / UC Course  I have reviewed the triage vital signs and the nursing notes.  Pertinent labs & imaging results that were available during my care of the patient were reviewed by me and considered in my medical decision making (see chart for details).     Reviewed symptoms with patient.  Given known tick bite with surrounding erythema will cover for Lyme with doxycycline for 10 days Patient to follow-up with PCP if symptoms do not improve ER precautions reviewed and patient verbalized understanding Final Clinical Impressions(s) / UC Diagnoses   Final diagnoses:  Tick bite of right thigh, initial encounter     Discharge Instructions      Doxycycline twice daily for 10 days Please follow-up with your PCP if your symptoms do not improve Please go to the ER for any worsening symptoms   ED Prescriptions     Medication Sig Dispense Auth. Provider   doxycycline (VIBRAMYCIN) 100 MG capsule Take 1 capsule (100 mg total) by mouth 2 (two) times daily. 20 capsule Radford Pax, NP      PDMP not reviewed this encounter.   Radford Pax, NP 04/17/23 1859

## 2024-01-06 ENCOUNTER — Ambulatory Visit
Admission: RE | Admit: 2024-01-06 | Discharge: 2024-01-06 | Disposition: A | Discharge status: Home / Self Care | Payer: Self-pay | Source: Ambulatory Visit

## 2024-01-06 VITALS — BP 117/72 | HR 85 | Temp 98.0°F | Resp 18 | Ht 72.0 in | Wt 155.0 lb

## 2024-01-06 DIAGNOSIS — J111 Influenza due to unidentified influenza virus with other respiratory manifestations: Secondary | ICD-10-CM

## 2024-01-06 LAB — POC COVID19/FLU A&B COMBO
Covid Antigen, POC: NEGATIVE
Influenza A Antigen, POC: POSITIVE — AB
Influenza B Antigen, POC: NEGATIVE

## 2024-01-06 MED ORDER — OSELTAMIVIR PHOSPHATE 75 MG PO CAPS: 75.0000 mg | ORAL_CAPSULE | Freq: Two times a day (BID) | ORAL | 0 refills | Status: DC

## 2024-01-06 NOTE — ED Provider Notes (Signed)
[facility]    CSN: [id number] Arrival date & time: 01/06/24  1151      History   Chief Complaint Chief Complaint  Patient presents with   Sore Throat    Cough, body aches, hot & cold - Entered by patient   Cough   Chills   Generalized Body Aches    HPI Anthony Frost is a 33 y.o. male.   Patient here today for evaluation of congestion, sore and itchy throat that started a few days ago.  He reports that 3 days ago he also developed some cough and bodyaches.  He has had fever up to 101.  He denies any vomiting or diarrhea.  The history is provided by the patient.  Sore Throat Pertinent negatives include no abdominal pain and no shortness of breath.  Cough Associated symptoms: chills, fever and sore throat   Associated symptoms: no ear pain, no eye discharge and no shortness of breath     Past Medical History:  Diagnosis Date   Allergy    Attention deficit     Patient Active Problem List   Diagnosis Date Noted   Aftercare following surgery 08/02/2018   Left foot pain 05/07/2018   Left ankle pain 05/07/2018   Depression 05/07/2018   Broken bones 05/07/2018    History reviewed. No pertinent surgical history.     Home Medications    Prior to Admission medications   Medication Sig Start Date End Date Taking? Authorizing Provider  oseltamivir (TAMIFLU) 75 MG capsule Take 1 capsule (75 mg total) by mouth every 12 (twelve) hours. 01/06/24  Yes Tomi Bamberger, PA-C  Phenylephrine-DM-GG-APAP [facility] CHILD COLD/SORE THROAT) 5-10-200-325 MG/10ML LIQD Take by mouth.   Yes [provider]  acetaminophen (TYLENOL) 500 MG tablet Take 1,000 mg by mouth every 6 (six) hours as needed.    [provider]  amphetamine-dextroamphetamine (ADDERALL XR) 30 MG 24 hr capsule Take 30 mg by mouth daily. Reported on 05/25/2016    [provider]  cyclobenzaprine (FLEXERIL) 10 MG tablet Take 1 tablet (10 mg total) by mouth at bedtime. For muscle  spasm 05/25/16   Tonye Pearson, MD  dicyclomine (BENTYL) 20 MG tablet Take 20 mg by mouth every 6 (six) hours as needed. 07/08/21   [provider]  doxycycline (VIBRAMYCIN) 100 MG capsule Take 1 capsule (100 mg total) by mouth 2 (two) times daily. 04/17/23   Radford Pax, NP  gabapentin (NEURONTIN) 300 MG capsule Take 300 mg by mouth as directed. 05/23/18   [provider]  lisdexamfetamine (VYVANSE) 40 MG capsule Take 40 mg by mouth every morning. Reported on 05/25/2016    [provider]  meloxicam (MOBIC) 15 MG tablet Take 1 tablet (15 mg total) by mouth daily. As antiinflammatory 05/25/16   Tonye Pearson, MD    Family History Family History  Problem Relation Age of Onset   High Cholesterol Mother    Cancer Father    High Cholesterol Father    Hypertension Father     Social History Social History   Tobacco Use   Smoking status: Former    Current packs/day: 0.10    Average packs/day: 0.1 packs/day for 3.0 years (0.3 ttl pk-yrs)    Types: Cigarettes   Smokeless tobacco: Current   Tobacco comments:    Pouches  Vaping Use   Vaping status: Never Used  Substance Use Topics   Alcohol use: Yes    Comment: social   Drug  use: Never     Allergies   Patient has no known allergies.   Review of Systems Review of Systems  Constitutional:  Positive for chills and fever.  HENT:  Positive for congestion and sore throat. Negative for ear pain.   Eyes:  Negative for discharge and redness.  Respiratory:  Positive for cough. Negative for shortness of breath.   Gastrointestinal:  Negative for abdominal pain, nausea and vomiting.     Physical Exam Triage Vital Signs ED Triage Vitals  Encounter Vitals Group     BP 01/06/24 1200 117/72     Systolic BP Percentile --      Diastolic BP Percentile --      Pulse Rate 01/06/24 1200 85     Resp 01/06/24 1200 18     Temp 01/06/24 1200 98 F (36.7 C)     Temp Source 01/06/24 1200 Oral     SpO2 01/06/24  1200 97 %     Weight 01/06/24 1158 155 lb (70.3 kg)     Height 01/06/24 1158 6' (1.829 m)     Head Circumference --      Peak Flow --      Pain Score 01/06/24 1155 0     Pain Loc --      Pain Education --      Exclude from Growth Chart --    No data found.  Updated Vital Signs BP 117/72 (BP Location: Left Arm)   Pulse 85   Temp 98 F (36.7 C) (Oral)   Resp 18   Ht 6' (1.829 m)   Wt 155 lb (70.3 kg)   SpO2 97%   BMI 21.02 kg/m   Visual Acuity Right Eye Distance:   Left Eye Distance:   Bilateral Distance:    Right Eye Near:   Left Eye Near:    Bilateral Near:     Physical Exam Vitals and nursing note reviewed.  Constitutional:      General: He is not in acute distress.    Appearance: Normal appearance. He is not ill-appearing.  HENT:     Head: Normocephalic and atraumatic.     Nose: Congestion present.     Mouth/Throat:     Mouth: Mucous membranes are moist.     Pharynx: Oropharynx is clear. No oropharyngeal exudate or posterior oropharyngeal erythema.  Eyes:     Conjunctiva/sclera: Conjunctivae normal.  Cardiovascular:     Rate and Rhythm: Normal rate and regular rhythm.     Heart sounds: Normal heart sounds. No murmur heard. Pulmonary:     Effort: Pulmonary effort is normal. No respiratory distress.     Breath sounds: Normal breath sounds. No wheezing, rhonchi or rales.  Skin:    General: Skin is warm and dry.  Neurological:     Mental Status: He is alert.  Psychiatric:        Mood and Affect: Mood normal.        Thought Content: Thought content normal.      UC Treatments / Results  Labs (all labs ordered are listed, but only abnormal results are displayed) Labs Reviewed  POC COVID19/FLU A&B COMBO - Abnormal; Notable for the following components:      Result Value   Influenza A Antigen, POC Positive (*)    All other components within normal limits    EKG   Radiology No results found.  Procedures Procedures (including critical care  time)  Medications Ordered in UC Medications - No data to display  Initial Impression / Assessment and Plan / UC Course  I have reviewed the triage vital signs and the nursing notes.  Pertinent labs & imaging results that were available during my care of the patient were reviewed by me and considered in my medical decision making (see chart for details).    Flu screening positive.  Will treat with Tamiflu and advised symptomatic treatment, increase fluids and rest otherwise.  Encouraged follow-up if no gradual improvement or with any further concerns.  Final Clinical Impressions(s) / UC Diagnoses   Final diagnoses:  Influenza   Discharge Instructions   None    ED Prescriptions     Medication Sig Dispense Auth. Provider   oseltamivir (TAMIFLU) 75 MG capsule Take 1 capsule (75 mg total) by mouth every 12 (twelve) hours. 10 capsule Tomi Bamberger, PA-C      PDMP not reviewed this encounter.   Tomi Bamberger, PA-C 01/06/24 1314

## 2024-01-06 NOTE — ED Triage Notes (Signed)
"  This started Wednesday after getting off work early, took a nap, then sore/itchy throat, Thursday started with fatigue, by Friday Cough/Body aches, later that day Fever up to 101 but gone by Saturday". No flu or COVID19 vaccine.

## 2024-03-10 ENCOUNTER — Ambulatory Visit
Admission: EM | Admit: 2024-03-10 | Discharge: 2024-03-10 | Disposition: A | Discharge status: Home / Self Care | Attending: Nurse Practitioner | Admitting: Nurse Practitioner

## 2024-03-10 ENCOUNTER — Other Ambulatory Visit: Payer: Self-pay

## 2024-03-10 ENCOUNTER — Encounter: Payer: Self-pay | Admitting: *Deleted

## 2024-03-10 DIAGNOSIS — B9789 Other viral agents as the cause of diseases classified elsewhere: Secondary | ICD-10-CM | POA: Diagnosis not present

## 2024-03-10 DIAGNOSIS — J019 Acute sinusitis, unspecified: Secondary | ICD-10-CM | POA: Diagnosis not present

## 2024-03-10 DIAGNOSIS — R059 Cough, unspecified: Secondary | ICD-10-CM

## 2024-03-10 LAB — POCT INFLUENZA A/B
Influenza A, POC: NEGATIVE
Influenza B, POC: NEGATIVE

## 2024-03-10 LAB — POC SARS CORONAVIRUS 2 AG -  ED: SARS Coronavirus 2 Ag: NEGATIVE

## 2024-03-10 MED ORDER — METHYLPREDNISOLONE 4 MG PO TBPK: ORAL_TABLET | ORAL | 0 refills | Status: AC

## 2024-03-10 MED ORDER — CHLORPHEN-PE-ACETAMINOPHEN 4-10-325 MG PO TABS: 1.0000 | ORAL_TABLET | Freq: Three times a day (TID) | ORAL | 0 refills | Status: AC

## 2024-03-10 MED ORDER — FLUTICASONE PROPIONATE 50 MCG/ACT NA SUSP: 2.0000 | Freq: Every day | NASAL | 0 refills | Status: AC

## 2024-03-10 NOTE — ED Provider Notes (Signed)
 [facility]    CSN: [id number] Arrival date & time: 03/10/24  8295      History   Chief Complaint Chief Complaint  Patient presents with   Cough   Nasal Congestion    HPI Anthony Frost is a 33 y.o. male.   Anthony Frost is a 33 year old male who presents for evaluation of a possible sinus infection. He reports nasal congestion, sneezing, cough, sinus pressure/pain, and a runny nose. Symptoms began 6 to 7 days ago and have rapidly worsened since this morning. He states that he woke up today feeling extremely fatigued, lacking energy, and with a sensation of heaviness in his arms and legs. He had a sore and scratchy throat during the first 2-3 days of illness, which has since resolved. He denies wheezing, shortness of breath, diarrhea, nausea, vomiting, dizziness or fevers. He did experience intermittent sweating a couple of days ago, which resolved the same day. His appetite is decreased, but he is able to eat and is staying well hydrated. He initially took Zyrtec at the onset of symptoms and switched to an over-the-counter sinus/mucus medication a few days ago. He reports sick contacts at work with similar symptoms. His past medical history is unremarkable and he is a non-smoker.  The following portions of the patient's history were reviewed and updated as appropriate: allergies, current medications, past family history, past medical history, past social history, past surgical history, and problem list.               Past Medical History:  Diagnosis Date   Allergy    Attention deficit     Patient Active Problem List   Diagnosis Date Noted   Aftercare following surgery 08/02/2018   Left foot pain 05/07/2018   Left ankle pain 05/07/2018   Depression 05/07/2018   Broken bones 05/07/2018    History reviewed. No pertinent surgical history.     Home Medications    Prior to Admission medications   Medication Sig Start Date End Date Taking?  Authorizing Provider  Chlorphen-PE-Acetaminophen  4-10-325 MG TABS Take 1 tablet by mouth 3 (three) times daily. 03/10/24  Yes Maryruth Sol, FNP  fluticasone  (FLONASE ) 50 MCG/ACT nasal spray Place 2 sprays into both nostrils daily. 03/10/24  Yes Maryruth Sol, FNP  methylPREDNISolone  (MEDROL  DOSEPAK) 4 MG TBPK tablet Take as directed 03/10/24  Yes Maryruth Sol, FNP    Family History Family History  Problem Relation Age of Onset   High Cholesterol Mother    Cancer Father    High Cholesterol Father    Hypertension Father     Social History Social History   Tobacco Use   Smoking status: Former    Current packs/day: 0.10    Average packs/day: 0.1 packs/day for 3.0 years (0.3 ttl pk-yrs)    Types: Cigarettes   Smokeless tobacco: Current   Tobacco comments:    Pouches  Vaping Use   Vaping status: Never Used  Substance Use Topics   Alcohol use: Yes    Comment: social   Drug use: Never     Allergies   Patient has no known allergies.   Review of Systems Review of Systems  Constitutional:  Positive for diaphoresis and fatigue. Negative for appetite change, chills and fever.  HENT:  Positive for congestion, rhinorrhea, sinus pressure, sinus pain, sneezing and sore throat. Negative for ear pain and trouble swallowing.   Respiratory:  Positive for cough. Negative for shortness of breath and wheezing.   Cardiovascular:  Negative  for chest pain.  Gastrointestinal:  Positive for nausea (a little this AM). Negative for abdominal pain, diarrhea and vomiting.  Musculoskeletal:  Positive for myalgias.  Neurological:  Positive for weakness (generalized) and headaches.  All other systems reviewed and are negative.    Physical Exam Triage Vital Signs ED Triage Vitals  Encounter Vitals Group     BP 03/10/24 1003 105/71     Systolic BP Percentile --      Diastolic BP Percentile --      Pulse Rate 03/10/24 1003 74     Resp 03/10/24 1003 16     Temp 03/10/24 1003 97.7 F  (36.5 C)     Temp Source 03/10/24 1003 Oral     SpO2 03/10/24 1003 97 %     Weight --      Height --      Head Circumference --      Peak Flow --      Pain Score 03/10/24 1000 3     Pain Loc --      Pain Education --      Exclude from Growth Chart --    No data found.  Updated Vital Signs BP 105/71 (BP Location: Right Arm)   Pulse 74   Temp 97.7 F (36.5 C) (Oral)   Resp 16   SpO2 97%   Visual Acuity Right Eye Distance:   Left Eye Distance:   Bilateral Distance:    Right Eye Near:   Left Eye Near:    Bilateral Near:     Physical Exam Vitals reviewed.  Constitutional:      General: He is awake. He is not in acute distress.    Appearance: Normal appearance. He is well-developed, well-groomed and normal weight. He is not ill-appearing, toxic-appearing or diaphoretic.  HENT:     Head: Normocephalic.     Nose: Congestion present.     Mouth/Throat:     Mouth: Mucous membranes are moist.     Pharynx: Oropharynx is clear. Uvula midline. No pharyngeal swelling, oropharyngeal exudate, posterior oropharyngeal erythema or uvula swelling.  Eyes:     General: Vision grossly intact.     Conjunctiva/sclera: Conjunctivae normal.  Cardiovascular:     Rate and Rhythm: Normal rate and regular rhythm.     Heart sounds: Normal heart sounds.  Pulmonary:     Effort: Pulmonary effort is normal.     Breath sounds: Normal breath sounds.  Musculoskeletal:        General: Normal range of motion.     Cervical back: Full passive range of motion without pain, normal range of motion and neck supple.  Lymphadenopathy:     Cervical: Cervical adenopathy (nontender) present.  Skin:    General: Skin is warm and dry.  Neurological:     General: No focal deficit present.     Mental Status: He is alert and oriented to person, place, and time.  Psychiatric:        Mood and Affect: Mood normal.        Behavior: Behavior normal. Behavior is cooperative.      UC Treatments / Results   Labs (all labs ordered are listed, but only abnormal results are displayed) Labs Reviewed  POCT INFLUENZA A/B - Normal  POC SARS CORONAVIRUS 2 AG -  ED    EKG   Radiology No results found.  Procedures Procedures (including critical care time)  Medications Ordered in UC Medications - No data to display  Initial Impression /  Assessment and Plan / UC Course  I have reviewed the triage vital signs and the nursing notes.  Pertinent labs & imaging results that were available during my care of the patient were reviewed by me and considered in my medical decision making (see chart for details).     33 year old male presenting with nasal congestion, sneezing, sinus pressure and pain, runny nose, sore throat, intermittent sweats, and decreased appetite for the past week, with worsening symptoms this morning with extreme fatigue, low energy, and a sensation of heaviness in his limbs. He is afebrile and nontoxic. Physical exam findings were as noted. Flu and COVID tests were negative. The clinical presentation is most consistent with viral sinusitis. A Medrol  dose pack, Norel AD, and Flonase  were prescribed. The expected course of illness, supportive care measures, and indications for follow-up were reviewed with the patient.  The following portions of the patient's history were reviewed and updated as appropriate: allergies, current medications, past family history, past medical history, past social history, past surgical history, and problem list. Final Clinical Impressions(s) / UC Diagnoses   Final diagnoses:  Acute viral sinusitis     Discharge Instructions      You have sinus infection. A sinus infection, also called sinusitis, is inflammation of your sinuses. Sinusitis can be due to bacteria, viruses or allergies. Your infection is likely due to a virus. Since your illness is caused by a virus, antibiotics won't help because antibiotics only treat infections caused by bacteria.  Take medications as prescribed. Make sure you finish all the prescribed medications even if you start to feel better. You may also take tylenol  and/or ibuprofen  as needed for pain and/or fever. Use Flonase  daily. Drink enough fluid to keep your urine pale yellow. Staying hydrated will also help to thin your mucus. Use a cool mist humidifier to keep the humidity level in your home above 50%. Inhale steam for 10-15 minutes, 3-4 times a day. You can do this in the bathroom while a hot shower is running and/or purchase over-the-counter vapor shower tablets which is great to help with nasal congestion.Try to limit your exposure to cool or dry air. Sleep with your head raised to decrease post-nasal drainage. Make sure you get enough sleep each night .Once you have finished your medication and your symptoms have improved, remember to replace your toothbrush to help prevent re-infection. If your symptoms do not improved after completing medications, please follow-up with your healthcare provider.      ED Prescriptions     Medication Sig Dispense Auth. Provider   methylPREDNISolone  (MEDROL  DOSEPAK) 4 MG TBPK tablet Take as directed 21 tablet Maryruth Sol, FNP   Chlorphen-PE-Acetaminophen  4-10-325 MG TABS Take 1 tablet by mouth 3 (three) times daily. 21 tablet Maryruth Sol, FNP   fluticasone  (FLONASE ) 50 MCG/ACT nasal spray Place 2 sprays into both nostrils daily. 16 g Maryruth Sol, FNP      PDMP not reviewed this encounter.   Maryruth Sol, [place] 03/10/24 1100

## 2024-03-10 NOTE — Discharge Instructions (Addendum)
 FLU & COVID NEGATIVE> You have sinus infection. A sinus infection, also called sinusitis, is inflammation of your sinuses. Sinusitis can be due to bacteria, viruses or allergies. Your infection is likely due to a virus. Since your illness is caused by a virus, antibiotics won't help because antibiotics only treat infections caused by bacteria. Take medications as prescribed. Make sure you finish all the prescribed medications even if you start to feel better. You may also take tylenol  and/or ibuprofen  as needed for pain and/or fever. Use Flonase  daily. Drink enough fluid to keep your urine pale yellow. Staying hydrated will also help to thin your mucus. Use a cool mist humidifier to keep the humidity level in your home above 50%. Inhale steam for 10-15 minutes, 3-4 times a day. You can do this in the bathroom while a hot shower is running and/or purchase over-the-counter vapor shower tablets which is great to help with nasal congestion.Try to limit your exposure to cool or dry air. Sleep with your head raised to decrease post-nasal drainage. Make sure you get enough sleep each night .Once you have finished your medication and your symptoms have improved, remember to replace your toothbrush to help prevent re-infection. If your symptoms do not improved after completing medications, please follow-up with your healthcare provider.

## 2024-03-10 NOTE — ED Triage Notes (Signed)
 Cough, scratchy throat, sinus pain for over a week. States he felt better yesterday and went to the gym. Today reports he feels fatigued and "drained" "eyes and limbs heavy". Left left work to come here

## 2024-03-14 ENCOUNTER — Ambulatory Visit
Admission: RE | Admit: 2024-03-14 | Discharge: 2024-03-14 | Disposition: A | Discharge status: Home / Self Care | Source: Ambulatory Visit | Attending: Family Medicine | Admitting: Family Medicine

## 2024-03-14 VITALS — BP 100/60 | HR 79 | Temp 97.8°F | Resp 16 | Ht 72.0 in | Wt 155.0 lb

## 2024-03-14 DIAGNOSIS — R5383 Other fatigue: Secondary | ICD-10-CM | POA: Diagnosis not present

## 2024-03-14 DIAGNOSIS — J019 Acute sinusitis, unspecified: Secondary | ICD-10-CM

## 2024-03-14 LAB — POCT MONO SCREEN (KUC): Mono, POC: NEGATIVE

## 2024-03-14 MED ORDER — AMOXICILLIN-POT CLAVULANATE 875-125 MG PO TABS: 1.0000 | ORAL_TABLET | Freq: Two times a day (BID) | ORAL | 0 refills | Status: AC

## 2024-03-14 NOTE — ED Triage Notes (Signed)
 Still no energy, heavy limbs, hard to breathe normally when moving around at work. - Entered by patient  "Tonight is the last dose of my prednisone , but I just feel the same if not worse". "I hear something in my chest with cough, chills last night through night and day".

## 2024-03-14 NOTE — ED Provider Notes (Signed)
 [facility]    CSN: [id number] Arrival date & time: 03/14/24  1350      History   Chief Complaint Chief Complaint  Patient presents with   Follow-up    HPI Anthony Frost is a 33 y.o. male.   Patient is here for continued URI symptoms  He feels the same if not worse.  He went back to work today for several hrs, but had to leave.  He is very fatigued, limbs feels heavy, sob with activity.  He is having some sinus congestion, drainage.  He did have a mild cough, but that has resolved.  Last night he felt very hot/cold until this morning, but that has resolved.  He did get a bit light headed at work today, but no n/v.  He has zero appetite.  He is forcing himself to eat/drink.  No urinary symptoms noted.  He did have 3 days of sore throat, resolved last week.  He still has nonstop runny nose, congestion.  Some pressure.  Today is day 8-9 of showing symptoms.  Flu/covid negative 4/21       Past Medical History:  Diagnosis Date   Allergy    Attention deficit     Patient Active Problem List   Diagnosis Date Noted   Aftercare following surgery 08/02/2018   Left foot pain 05/07/2018   Left ankle pain 05/07/2018   Depression 05/07/2018   Broken bones 05/07/2018    History reviewed. No pertinent surgical history.     Home Medications    Prior to Admission medications   Medication Sig Start Date End Date Taking? Authorizing Provider  Chlorphen-PE-Acetaminophen  4-10-325 MG TABS Take 1 tablet by mouth 3 (three) times daily. 03/10/24  Yes Maryruth Sol, FNP  fluticasone  (FLONASE ) 50 MCG/ACT nasal spray Place 2 sprays into both nostrils daily. 03/10/24  Yes Maryruth Sol, FNP  methylPREDNISolone  (MEDROL  DOSEPAK) 4 MG TBPK tablet Take as directed 03/10/24  Yes Maryruth Sol, FNP    Family History Family History  Problem Relation Age of Onset   High Cholesterol Mother    Cancer Father    High Cholesterol Father    Hypertension Father      Social History Social History   Tobacco Use   Smoking status: Former    Current packs/day: 0.10    Average packs/day: 0.1 packs/day for 3.0 years (0.3 ttl pk-yrs)    Types: Cigarettes   Smokeless tobacco: Current   Tobacco comments:    Pouches  Vaping Use   Vaping status: Never Used  Substance Use Topics   Alcohol use: Yes    Comment: social   Drug use: Never     Allergies   Patient has no known allergies.   Review of Systems Review of Systems  Constitutional:  Positive for chills and fatigue.  HENT:  Positive for congestion, rhinorrhea and sinus pressure.   Respiratory: Negative.    Cardiovascular: Negative.   Gastrointestinal:  Positive for diarrhea.  Genitourinary: Negative.   Musculoskeletal: Negative.   Psychiatric/Behavioral: Negative.       Physical Exam Triage Vital Signs ED Triage Vitals  Encounter Vitals Group     BP 03/14/24 1417 100/60     Systolic BP Percentile --      Diastolic BP Percentile --      Pulse Rate 03/14/24 1417 79     Resp 03/14/24 1417 16     Temp 03/14/24 1417 97.8 F (36.6 C)     Temp Source 03/14/24 1417 Oral  SpO2 03/14/24 1417 95 %     Weight 03/14/24 1415 155 lb (70.3 kg)     Height 03/14/24 1415 6' (1.829 m)     Head Circumference --      Peak Flow --      Pain Score 03/14/24 1411 0     Pain Loc --      Pain Education --      Exclude from Growth Chart --    No data found.  Updated Vital Signs BP 100/60 (BP Location: Left Arm)   Pulse 79   Temp 97.8 F (36.6 C) (Oral)   Resp 16   Ht 6' (1.829 m)   Wt 70.3 kg   SpO2 95%   BMI 21.02 kg/m   Visual Acuity Right Eye Distance:   Left Eye Distance:   Bilateral Distance:    Right Eye Near:   Left Eye Near:    Bilateral Near:     Physical Exam Constitutional:      General: He is not in acute distress.    Appearance: Normal appearance. He is normal weight. He is not ill-appearing or toxic-appearing.  HENT:     Head: Normocephalic.     Nose:      Right Sinus: Maxillary sinus tenderness present.     Left Sinus: Maxillary sinus tenderness present.     Mouth/Throat:     Mouth: Mucous membranes are moist.     Pharynx: No oropharyngeal exudate or posterior oropharyngeal erythema.  Cardiovascular:     Rate and Rhythm: Normal rate and regular rhythm.  Pulmonary:     Effort: Pulmonary effort is normal. No respiratory distress.     Breath sounds: Normal breath sounds. No wheezing or rhonchi.  Abdominal:     Palpations: Abdomen is soft.     Tenderness: There is no abdominal tenderness. There is no guarding or rebound.  Musculoskeletal:        General: No swelling or tenderness. Normal range of motion.     Cervical back: Normal range of motion and neck supple. No tenderness.  Lymphadenopathy:     Cervical: No cervical adenopathy.  Skin:    General: Skin is warm.  Neurological:     General: No focal deficit present.     Mental Status: He is alert.  Psychiatric:        Mood and Affect: Mood normal.      UC Treatments / Results  Labs (all labs ordered are listed, but only abnormal results are displayed) Labs Reviewed  POCT MONO SCREEN [facility]) - Normal    EKG   Radiology No results found.  Procedures Procedures (including critical care time)  Medications Ordered in UC Medications - No data to display  Initial Impression / Assessment and Plan / UC Course  I have reviewed the triage vital signs and the nursing notes.  Pertinent labs & imaging results that were available during my care of the patient were reviewed by me and considered in my medical decision making (see chart for details).   Final Clinical Impressions(s) / UC Diagnoses   Final diagnoses:  Other fatigue  Acute non-recurrent sinusitis, unspecified location     Discharge Instructions      You were seen today for continued symptoms of not feeling well.  Your mono test was negative today.  I have started you on an antibiotic today for possible sinus  infection.  I recommend you get rest, and continue to push fluids as best you can.  If  you continue to feel poorly you need to return for further follow up, or go to the ER for further evaluation.     ED Prescriptions     Medication Sig Dispense Auth. Provider   amoxicillin-clavulanate (AUGMENTIN) 875-125 MG tablet Take 1 tablet by mouth every 12 (twelve) hours for 10 days. 20 tablet Lesle Ras, MD      PDMP not reviewed this encounter.   Lesle Ras, MD 03/14/24 [phone]

## 2024-03-14 NOTE — Discharge Instructions (Addendum)
 You were seen today for continued symptoms of not feeling well.  Your mono test was negative today.  I have started you on an antibiotic today for possible sinus infection.  I recommend you get rest, and continue to push fluids as best you can.  If you continue to feel poorly you need to return for further follow up, or go to the ER for further evaluation.
